# Patient Record
Sex: Female | Born: 1950 | Race: White | Hispanic: No | Marital: Married | Smoking: Current every day smoker
Health system: Southern US, Community
[De-identification: ages and names within clinical notes are randomized; demographics above are authoritative.]

## PROBLEM LIST (undated history)

## (undated) DIAGNOSIS — H269 Unspecified cataract: Secondary | ICD-10-CM

## (undated) DIAGNOSIS — D649 Anemia, unspecified: Secondary | ICD-10-CM

## (undated) DIAGNOSIS — R05 Cough: Secondary | ICD-10-CM

## (undated) DIAGNOSIS — Z720 Tobacco use: Secondary | ICD-10-CM

## (undated) DIAGNOSIS — R51 Headache: Secondary | ICD-10-CM

## (undated) DIAGNOSIS — R059 Cough, unspecified: Secondary | ICD-10-CM

## (undated) DIAGNOSIS — I739 Peripheral vascular disease, unspecified: Secondary | ICD-10-CM

## (undated) HISTORY — PX: ABDOMINAL HYSTERECTOMY: SHX81

---

## 1999-06-29 ENCOUNTER — Ambulatory Visit (HOSPITAL_COMMUNITY): Admission: RE | Admit: 1999-06-29 | Discharge: 1999-06-29 | Payer: Self-pay | Admitting: Gastroenterology

## 2001-07-16 ENCOUNTER — Encounter: Admission: RE | Admit: 2001-07-16 | Discharge: 2001-07-16 | Payer: Self-pay | Admitting: Family Medicine

## 2001-07-16 ENCOUNTER — Encounter: Payer: Self-pay | Admitting: Family Medicine

## 2001-07-27 ENCOUNTER — Encounter: Admission: RE | Admit: 2001-07-27 | Discharge: 2001-07-27 | Payer: Self-pay | Admitting: Family Medicine

## 2001-07-27 ENCOUNTER — Encounter: Payer: Self-pay | Admitting: Family Medicine

## 2001-11-20 ENCOUNTER — Encounter: Payer: Self-pay | Admitting: Family Medicine

## 2001-11-20 ENCOUNTER — Encounter: Admission: RE | Admit: 2001-11-20 | Discharge: 2001-11-20 | Payer: Self-pay | Admitting: Family Medicine

## 2002-08-31 ENCOUNTER — Encounter: Admission: RE | Admit: 2002-08-31 | Discharge: 2002-08-31 | Payer: Self-pay | Admitting: Family Medicine

## 2002-08-31 ENCOUNTER — Encounter: Payer: Self-pay | Admitting: Family Medicine

## 2004-11-07 ENCOUNTER — Emergency Department: Payer: Self-pay | Admitting: Emergency Medicine

## 2004-11-08 ENCOUNTER — Ambulatory Visit: Payer: Self-pay | Admitting: Emergency Medicine

## 2006-01-14 IMAGING — CT CT ABD-PELV W/ CM
1 of 2 series · 15 of 32 positions shown, 19 images · non-contrast
Comparison: none

REASON FOR EXAM: LUQ and RUQ abdominal pain
COMMENTS:

[Series 2: soft tissue · axial · 0.74mm/px · z∈[-640,-248]mm · 15 of 55 slices shown, 19 images]
[im 3/55  soft-tissue]
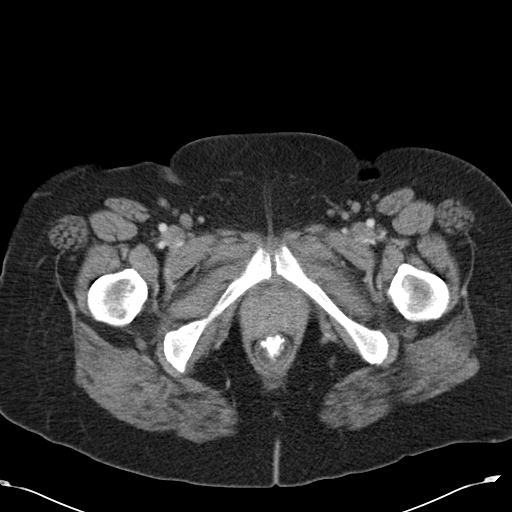
[im 3/55  bone]
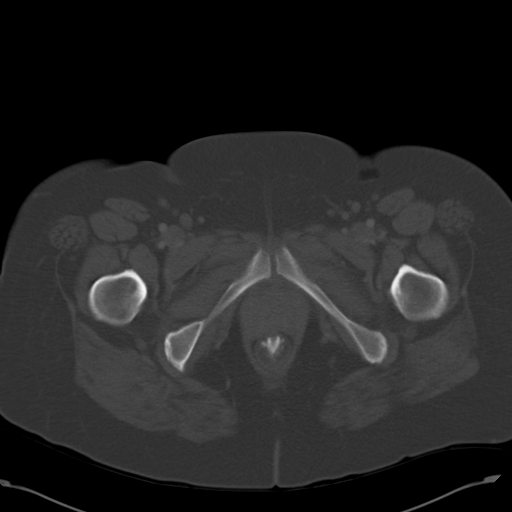
[im 8/55  soft-tissue]
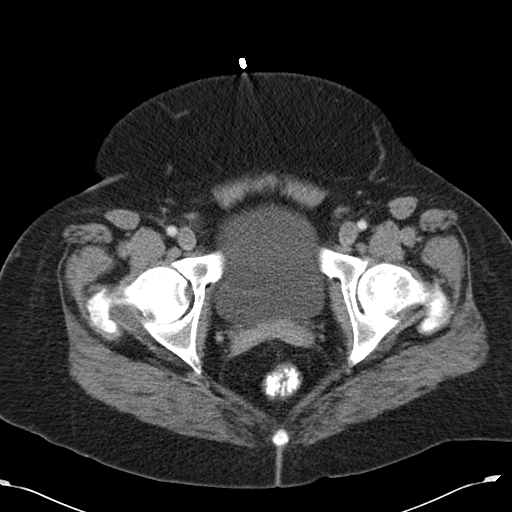
[im 12/55  soft-tissue]
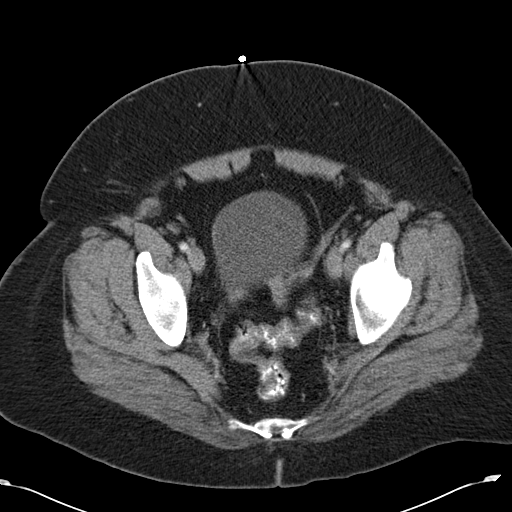
[im 15/55  soft-tissue]
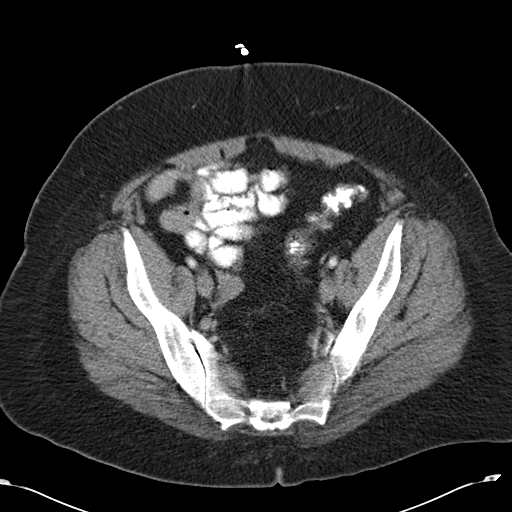
[im 19/55  soft-tissue]
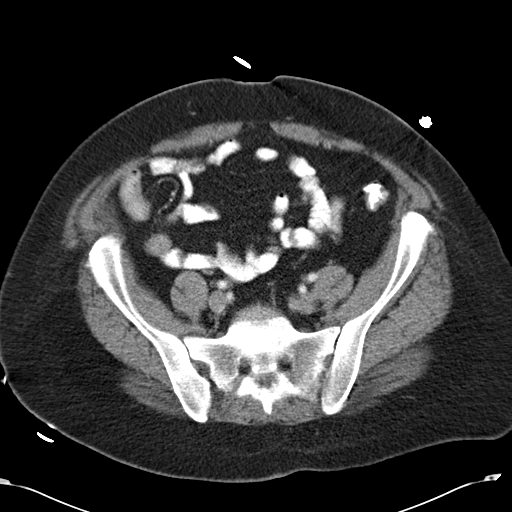
[im 24/55  soft-tissue]
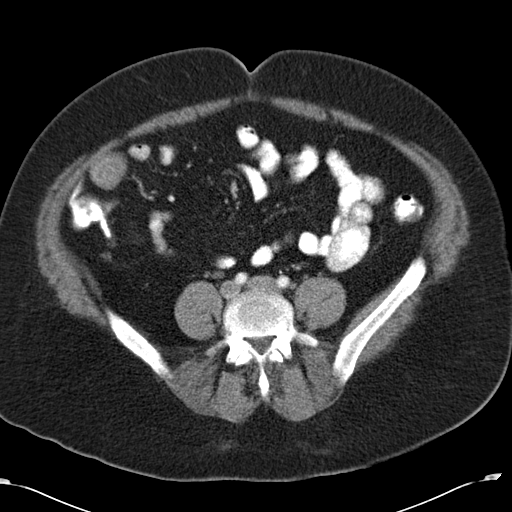
[im 29/55  soft-tissue]
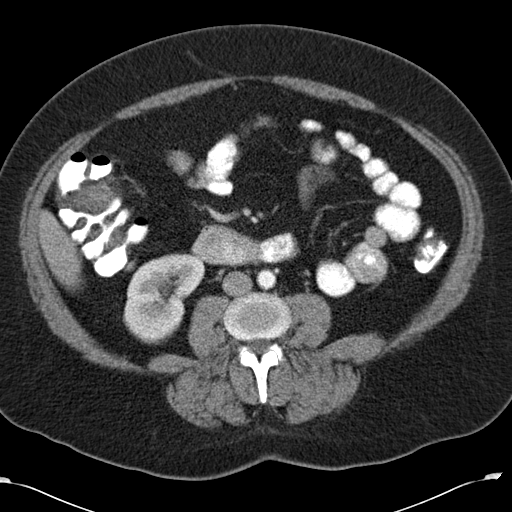
[im 31/55  soft-tissue]
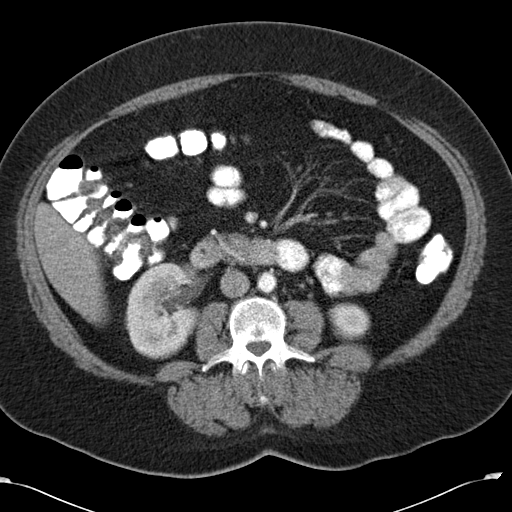
[im 36/55  soft-tissue]
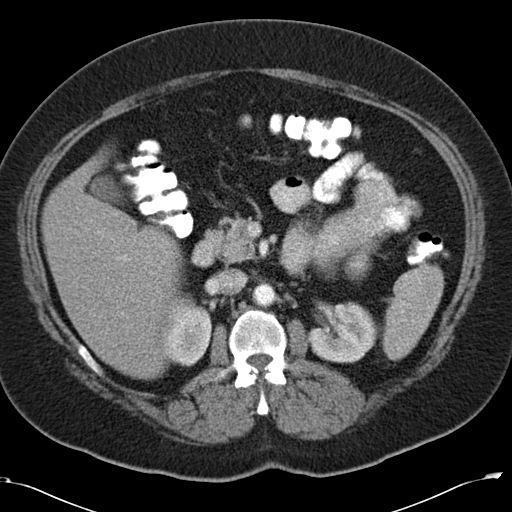
[im 36/55  bone]
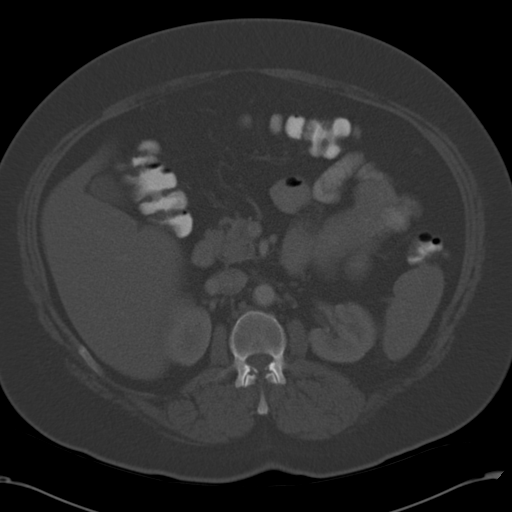
[im 40/55  soft-tissue]
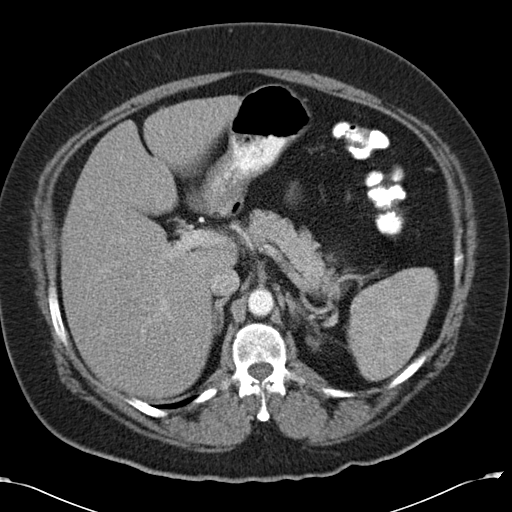
[im 43/55  soft-tissue]
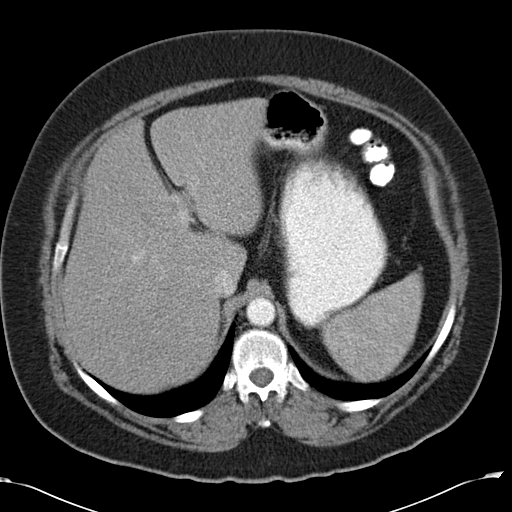
[im 45/55  lung]
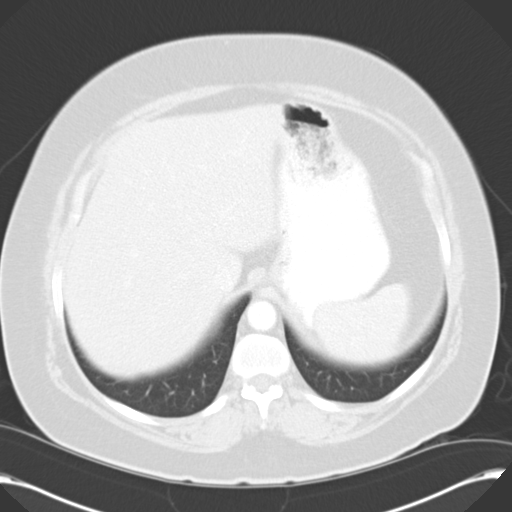
[im 47/55  soft-tissue]
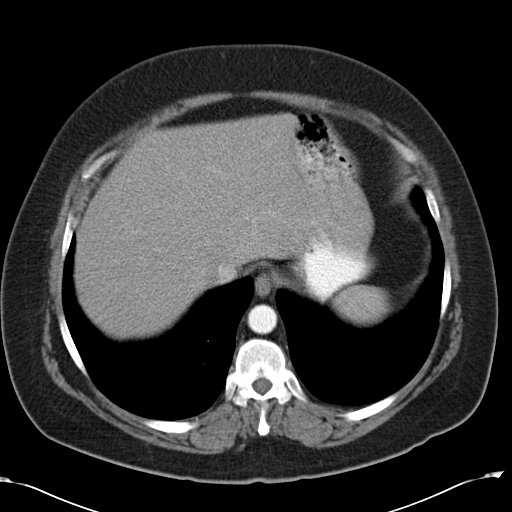
[im 47/55  lung]
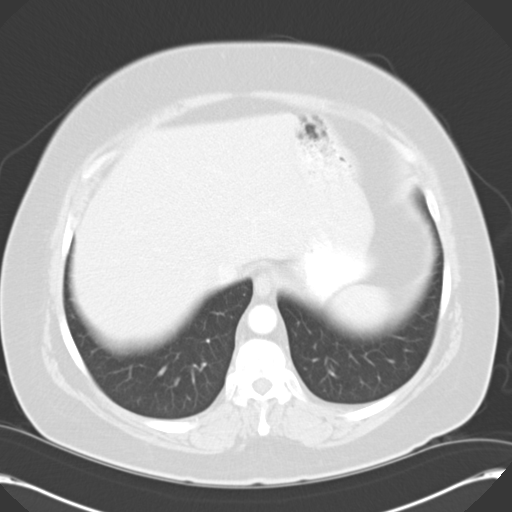
[im 50/55  lung]
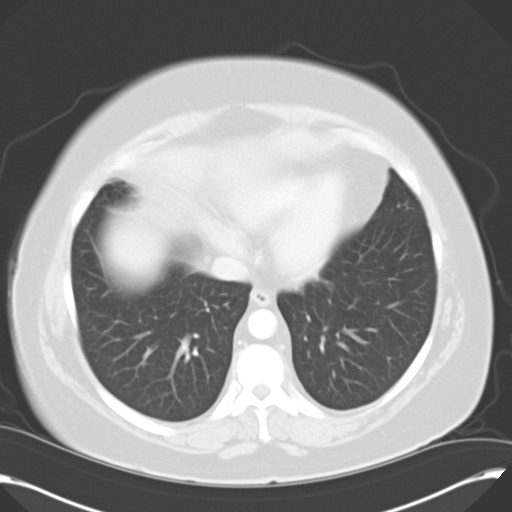
[im 52/55  soft-tissue]
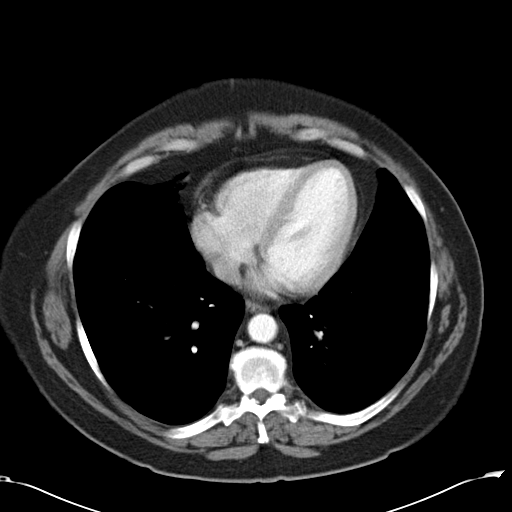
[im 52/55  lung]
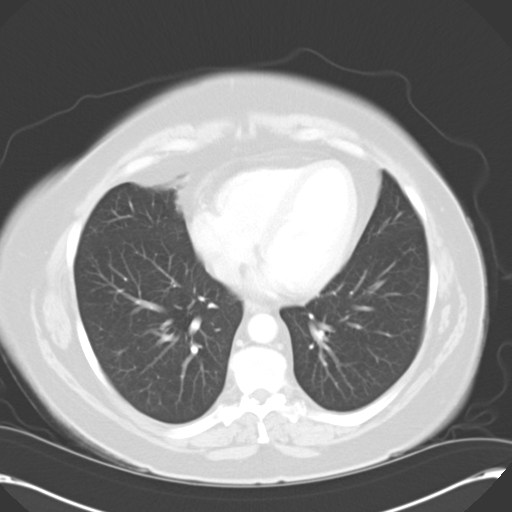

[15 of 32 positions shown; findings below may reference images not displayed]

PROCEDURE:     CT  - CT ABDOMEN / PELVIS  W  - November 08, 2004  [DATE]

RESULT:     IV and oral contrast enhanced CT of abdomen and pelvis
demonstrates the lung bases are normally aerated. There is normal appearance
and enhancement of the spleen and liver. No radiopaque gallstones are
evident. The pancreas appears to be normal. The aorta is normal in caliber.
There is no retroperitoneal mass or adenopathy. The adrenal glands and
kidneys appear unremarkable. No oral contrast is seen within the terminal
ileum. There is contrast in the colon and in other loops of small bowel. The
terminal ileum appears to be enlarged. There does not appear to be a
significant degree of surrounding inflammatory stranding within the
mesentery. More proximally the small bowel appears to show a normal caliber.
The appearance suggests thickening of the wall of the terminal ileum which
could be secondary to regional enteritis such as Crohn's disease. The
sigmoid colon also shows a slightly thickened appearance again which is
nonspecific. Correlation for the possibility of Crohn's disease would be
recommended. No abscess, free air or free fluid is evident. Again, there is
no adenopathy. The kidneys enhance normally.
IMPRESSION: Abnormal appearance of the terminal ileum. No oral contrast
is seen within the terminal ileum but the size appears to be enlarged and is
worrisome for Crohn's disease. There is also some mild thickening in the
sigmoid colon which is nonspecific but Crohn's could give a similar
appearance.

The findings were phoned to the [HOSPITAL] the completion of
the study.

## 2011-08-15 ENCOUNTER — Other Ambulatory Visit: Payer: Self-pay | Admitting: Surgical

## 2011-11-20 ENCOUNTER — Encounter (HOSPITAL_BASED_OUTPATIENT_CLINIC_OR_DEPARTMENT_OTHER): Payer: Self-pay | Admitting: *Deleted

## 2011-11-20 NOTE — H&P (Signed)
Sheila Fleming DOB: Mar 31, 1951  Chief Complaint: left knee pain  History of Present Illness The patient is a 61 year old female who is scheduled for a left knee arthroscopy with medial menisectomy by Dr. Darrelyn Hillock on Tuesday November 26, 2011. The patient has been followed for their left knee pain and swelling. Symptoms reported include pain, swelling, locking, popping and instability. She bumped her knee on a desk at work but she said she had knee pain way before that. She has obvious arthritic changes on her x-rays. Her medial joint is narrow and MRI reviewed shows a sort of chronic appearing complex tear of the medial meniscus with arthritis.   Problem List/Past MedicalHistory Osteoarthritis, Knee (715.96) Sprain/Strain, Lumbar (847.2) Hypertension Hypercholesterolemia Migraine Headache   Allergies No Known Drug Allergies.    Family History Cancer. mother Diabetes Mellitus. mother Rheumatoid Arthritis. mother Congestive Heart Failure. mother Heart Disease. father and grandfather mothers side Hypertension. mother mother and father Chronic Obstructive Lung Disease. mother   Social History Exercise. Exercises weekly does running / walking Living situation. live with spouse Children. 2 Marital status. married Tobacco use. current every day smoker; smoke(d) 1 pack(s) per day; uses less than half 1/2 can(s) smokeless per week Tobacco / smoke exposure. yes Alcohol use. current drinker; only occasionally per week   Medication History Aspirin 81mg  daily B complex vitamin Os-cal 600 mg Fish oil     Past Surgical History Hysterectomy. partial (non-cancerous)    Vitals Weight: 230 lbs Height: 66 in BP: HR:  Review of Systems General:Not Present- Chills, Fever, Night Sweats, Appetite Loss, Fatigue, Feeling sick, Weight Gain and Weight Loss. HEENT:Not Present- Sensitivity to light, Hearing problems, Nose Bleed and Ringing in  the Ears. Neck:Not Present- Swollen Glands and Neck Mass. Respiratory:Not Present- Snoring, Chronic Cough, Bloody sputum and Dyspnea. Cardiovascular:Present- Leg Cramps. Not Present- Shortness of Breath, Chest Pain, Swelling of Extremities and Palpitations. Gastrointestinal:Not Present- Bloody Stool, Heartburn, Abdominal Pain, Vomiting, Nausea and Incontinence of Stool. Female Genitourinary:Not Present- Blood in Urine, Menstrual Irregularities, Frequency, Incontinence and Nocturia. Musculoskeletal:Present- Joint Stiffness, Joint Swelling and Joint Pain. Not Present- Muscle Weakness, Muscle Pain and Back Pain. Psychiatric:Not Present- Anxiety, Depression and Memory Loss. Endocrine:Not Present- Cold Intolerance, Heat Intolerance, Excessive hunger and Excessive Thirst. Hematology:Not Present- Abnormal Bleeding, Anemia, Blood Clots and Easy Bruising.    Physical Exam She is alert and oriented. She is in no acute distress. She does have pain with range of motion of the left knee specifically extension. She is unable to fully extend, lacking about 10 degrees of extension. She does have a full range of motion with flexion. there is tenderness to palpation over the medial joint line. Calves are soft and nontender. Sensation and circulation are intact. There is a moderate crepitus in the left knee. In the right knee there is a mild crepitus with range of motion but no pain with range of motion or tenderness to palpation. Heart sounds normal with no murmurs. Lungs clear to auscultation. Abdomen soft and nontender. Bowel sounds active. EOM intact. Neck supple, no bruits.  Partial dentures on the bottom. Full on the top.     RADIOGRAPHS: AP and lateral views of the left knee, this shows she does have some arthritic changes in the medial compartment and almost nearly bone on bone in the patellofemoral compartment of the left knee. No fractures were noted.   Assessment & Plan Osteoarthritis,  Knee (715.96) Acute Medial Meniscal Tear (836.0) Left knee arthroscopy with medial menisectomy  Ardeen Jourdain, PA-C

## 2011-11-20 NOTE — Progress Notes (Signed)
Pt instructed npo p mn 8/19.  To [facility] 8/20 @ 1030., needs hgb, ekg on arrival. 

## 2011-11-26 ENCOUNTER — Ambulatory Visit (HOSPITAL_BASED_OUTPATIENT_CLINIC_OR_DEPARTMENT_OTHER): Payer: No Typology Code available for payment source | Admitting: Anesthesiology

## 2011-11-26 ENCOUNTER — Other Ambulatory Visit: Payer: Self-pay

## 2011-11-26 ENCOUNTER — Encounter (HOSPITAL_BASED_OUTPATIENT_CLINIC_OR_DEPARTMENT_OTHER): Payer: Self-pay | Admitting: Anesthesiology

## 2011-11-26 ENCOUNTER — Encounter (HOSPITAL_BASED_OUTPATIENT_CLINIC_OR_DEPARTMENT_OTHER): Payer: Self-pay | Admitting: *Deleted

## 2011-11-26 ENCOUNTER — Encounter (HOSPITAL_BASED_OUTPATIENT_CLINIC_OR_DEPARTMENT_OTHER)
Admission: RE | Disposition: A | Discharge status: Home / Self Care | Payer: Self-pay | Source: Ambulatory Visit | Attending: Orthopedic Surgery

## 2011-11-26 ENCOUNTER — Ambulatory Visit (HOSPITAL_BASED_OUTPATIENT_CLINIC_OR_DEPARTMENT_OTHER)
Admission: RE | Admit: 2011-11-26 | Discharge: 2011-11-26 | Disposition: A | Discharge status: Home / Self Care | Payer: No Typology Code available for payment source | Source: Ambulatory Visit | Attending: Orthopedic Surgery | Admitting: Orthopedic Surgery

## 2011-11-26 DIAGNOSIS — Z7982 Long term (current) use of aspirin: Secondary | ICD-10-CM | POA: Insufficient documentation

## 2011-11-26 DIAGNOSIS — G43909 Migraine, unspecified, not intractable, without status migrainosus: Secondary | ICD-10-CM | POA: Insufficient documentation

## 2011-11-26 DIAGNOSIS — I10 Essential (primary) hypertension: Secondary | ICD-10-CM | POA: Insufficient documentation

## 2011-11-26 DIAGNOSIS — M1710 Unilateral primary osteoarthritis, unspecified knee: Secondary | ICD-10-CM | POA: Insufficient documentation

## 2011-11-26 DIAGNOSIS — M23329 Other meniscus derangements, posterior horn of medial meniscus, unspecified knee: Secondary | ICD-10-CM | POA: Diagnosis present

## 2011-11-26 DIAGNOSIS — IMO0002 Reserved for concepts with insufficient information to code with codable children: Secondary | ICD-10-CM | POA: Insufficient documentation

## 2011-11-26 DIAGNOSIS — S83289A Other tear of lateral meniscus, current injury, unspecified knee, initial encounter: Secondary | ICD-10-CM | POA: Insufficient documentation

## 2011-11-26 DIAGNOSIS — X58XXXA Exposure to other specified factors, initial encounter: Secondary | ICD-10-CM | POA: Insufficient documentation

## 2011-11-26 DIAGNOSIS — M23349 Other meniscus derangements, anterior horn of lateral meniscus, unspecified knee: Secondary | ICD-10-CM | POA: Diagnosis present

## 2011-11-26 DIAGNOSIS — E7800 Pure hypercholesterolemia, unspecified: Secondary | ICD-10-CM | POA: Insufficient documentation

## 2011-11-26 DIAGNOSIS — M1712 Unilateral primary osteoarthritis, left knee: Secondary | ICD-10-CM | POA: Diagnosis present

## 2011-11-26 HISTORY — DX: Cough: R05

## 2011-11-26 HISTORY — DX: Headache: R51

## 2011-11-26 HISTORY — DX: Peripheral vascular disease, unspecified: I73.9

## 2011-11-26 HISTORY — DX: Unspecified cataract: H26.9

## 2011-11-26 HISTORY — DX: Cough, unspecified: R05.9

## 2011-11-26 HISTORY — DX: Anemia, unspecified: D64.9

## 2011-11-26 HISTORY — PX: KNEE ARTHROSCOPY: SHX127

## 2011-11-26 LAB — POCT HEMOGLOBIN-HEMACUE: Hemoglobin: 13.5 g/dL (ref 12.0–15.0)

## 2011-11-26 SURGERY — ARTHROSCOPY, KNEE
Anesthesia: General | Site: Knee | Laterality: Left | Wound class: Clean

## 2011-11-26 MED ORDER — FENTANYL CITRATE 0.05 MG/ML IJ SOLN
INTRAMUSCULAR | Status: DC | PRN
  Administered 2011-11-26: 25 ug via INTRAVENOUS
  Administered 2011-11-26 (×2): 50 ug via INTRAVENOUS
  Administered 2011-11-26 (×3): 25 ug via INTRAVENOUS

## 2011-11-26 MED ORDER — FENTANYL CITRATE 0.05 MG/ML IJ SOLN
25.0000 ug | INTRAMUSCULAR | Status: DC | PRN
  Administered 2011-11-26: 25 ug via INTRAVENOUS

## 2011-11-26 MED ORDER — LIDOCAINE HCL (CARDIAC) 20 MG/ML IV SOLN
INTRAVENOUS | Status: DC | PRN
  Administered 2011-11-26: 50 mg via INTRAVENOUS

## 2011-11-26 MED ORDER — MIDAZOLAM HCL 5 MG/5ML IJ SOLN
INTRAMUSCULAR | Status: DC | PRN
  Administered 2011-11-26: 2 mg via INTRAVENOUS

## 2011-11-26 MED ORDER — OXYCODONE-ACETAMINOPHEN 10-650 MG PO TABS: 1.0000 | ORAL_TABLET | Freq: Four times a day (QID) | ORAL | Status: AC | PRN

## 2011-11-26 MED ORDER — OXYCODONE-ACETAMINOPHEN 5-325 MG PO TABS
2.0000 | ORAL_TABLET | Freq: Once | ORAL | Status: AC
  Administered 2011-11-26: 2 via ORAL

## 2011-11-26 MED ORDER — BUPIVACAINE-EPINEPHRINE 0.25% -1:200000 IJ SOLN
INTRAMUSCULAR | Status: DC | PRN
  Administered 2011-11-26: 30 mL

## 2011-11-26 MED ORDER — PROMETHAZINE HCL 25 MG/ML IJ SOLN: 6.2500 mg | INTRAMUSCULAR | Status: DC | PRN

## 2011-11-26 MED ORDER — GLYCOPYRROLATE 0.2 MG/ML IJ SOLN
INTRAMUSCULAR | Status: DC | PRN
  Administered 2011-11-26: 0.2 mg via INTRAVENOUS

## 2011-11-26 MED ORDER — SODIUM CHLORIDE 0.9 % IR SOLN
Status: DC | PRN
  Administered 2011-11-26: 9000 mL

## 2011-11-26 MED ORDER — CEFAZOLIN SODIUM-DEXTROSE 2-3 GM-% IV SOLR
2.0000 g | Freq: Once | INTRAVENOUS | Status: AC
  Administered 2011-11-26: 2 g via INTRAVENOUS

## 2011-11-26 MED ORDER — BACITRACIN-NEOMYCIN-POLYMYXIN 400-5-5000 EX OINT
TOPICAL_OINTMENT | CUTANEOUS | Status: DC | PRN
  Administered 2011-11-26: 1 via TOPICAL

## 2011-11-26 MED ORDER — LACTATED RINGERS IV SOLN
INTRAVENOUS | Status: DC
  Administered 2011-11-26 (×2): via INTRAVENOUS

## 2011-11-26 MED ORDER — DEXAMETHASONE SODIUM PHOSPHATE 4 MG/ML IJ SOLN
INTRAMUSCULAR | Status: DC | PRN
  Administered 2011-11-26: 8 mg via INTRAVENOUS

## 2011-11-26 MED ORDER — ONDANSETRON HCL 4 MG/2ML IJ SOLN
INTRAMUSCULAR | Status: DC | PRN
  Administered 2011-11-26: 4 mg via INTRAVENOUS

## 2011-11-26 MED ORDER — PROPOFOL 10 MG/ML IV EMUL
INTRAVENOUS | Status: DC | PRN
  Administered 2011-11-26: 200 mg via INTRAVENOUS

## 2011-11-26 SURGICAL SUPPLY — 43 items
BANDAGE ELASTIC 4 VELCRO ST LF (GAUZE/BANDAGES/DRESSINGS) ×2 IMPLANT
BANDAGE ELASTIC 6 VELCRO ST LF (GAUZE/BANDAGES/DRESSINGS) ×2 IMPLANT
BLADE CUDA 5.5 (BLADE) IMPLANT
BLADE CUTTER GATOR 3.5 (BLADE) IMPLANT
BLADE CUTTER MENIS 5.5 (BLADE) IMPLANT
BLADE GREAT WHITE 4.2 (BLADE) ×2 IMPLANT
BLADE SURG 15 STRL LF DISP TIS (BLADE) IMPLANT
BLADE SURG 15 STRL SS (BLADE)
BNDG COHESIVE 6X5 TAN NS LF (GAUZE/BANDAGES/DRESSINGS) ×2 IMPLANT
CANISTER SUCT LVC 12 LTR MEDI- (MISCELLANEOUS) ×4 IMPLANT
CANISTER SUCTION 2500CC (MISCELLANEOUS) ×2 IMPLANT
CLOTH BEACON ORANGE TIMEOUT ST (SAFETY) ×2 IMPLANT
DRAPE ARTHROSCOPY W/POUCH 114 (DRAPES) ×2 IMPLANT
DRAPE LG THREE QUARTER DISP (DRAPES) ×2 IMPLANT
DRSG EMULSION OIL 3X3 NADH (GAUZE/BANDAGES/DRESSINGS) ×2 IMPLANT
DRSG PAD ABDOMINAL 8X10 ST (GAUZE/BANDAGES/DRESSINGS) ×2 IMPLANT
DURAPREP 26ML APPLICATOR (WOUND CARE) ×2 IMPLANT
ELECT MENISCUS 165MM 90D (ELECTRODE) IMPLANT
ELECT REM PT RETURN 9FT ADLT (ELECTROSURGICAL)
ELECTRODE REM PT RTRN 9FT ADLT (ELECTROSURGICAL) IMPLANT
GLOVE BIO SURGEON STRL SZ 6.5 (GLOVE) ×2 IMPLANT
GLOVE ECLIPSE 8.0 STRL XLNG CF (GLOVE) ×2 IMPLANT
GLOVE INDICATOR 6.5 STRL GRN (GLOVE) ×2 IMPLANT
GLOVE INDICATOR 8.5 STRL (GLOVE) ×4 IMPLANT
GLOVE SURG ORTHO 6.0 STRL STRW (GLOVE) ×2 IMPLANT
GOWN PREVENTION PLUS LG XLONG (DISPOSABLE) ×2 IMPLANT
GOWN STRL REIN XL XLG (GOWN DISPOSABLE) ×2 IMPLANT
IV NS IRRIG 3000ML ARTHROMATIC (IV SOLUTION) ×6 IMPLANT
KNEE WRAP E Z 3 GEL PACK (MISCELLANEOUS) ×2 IMPLANT
MINI VAC (SURGICAL WAND) ×2 IMPLANT
PACK ARTHROSCOPY DSU (CUSTOM PROCEDURE TRAY) ×2 IMPLANT
PACK BASIN DAY SURGERY FS (CUSTOM PROCEDURE TRAY) ×2 IMPLANT
PADDING CAST COTTON 6X4 STRL (CAST SUPPLIES) ×2 IMPLANT
PENCIL BUTTON HOLSTER BLD 10FT (ELECTRODE) IMPLANT
SET ARTHROSCOPY TUBING (MISCELLANEOUS) ×1
SET ARTHROSCOPY TUBING LN (MISCELLANEOUS) ×1 IMPLANT
SPONGE GAUZE 4X4 12PLY (GAUZE/BANDAGES/DRESSINGS) ×2 IMPLANT
SPONGE SURGIFOAM ABS GEL 12-7 (HEMOSTASIS) IMPLANT
SUT ETHILON 3 0 PS 1 (SUTURE) ×2 IMPLANT
SYR CONTROL 10ML LL (SYRINGE) IMPLANT
TOWEL OR 17X24 6PK STRL BLUE (TOWEL DISPOSABLE) ×2 IMPLANT
WAND 90 DEG TURBOVAC W/CORD (SURGICAL WAND) ×2 IMPLANT
WATER STERILE IRR 500ML POUR (IV SOLUTION) ×2 IMPLANT

## 2011-11-26 NOTE — Transfer of Care (Signed)
Immediate Anesthesia Transfer of Care Note  Patient: Sheila Fleming  Procedure(s) Performed: Procedure(s) (LRB): ARTHROSCOPY KNEE (Left)  Patient Location: PACU  Anesthesia Type: General  Level of Consciousness: awake, oriented, sedated and patient cooperative  Airway & Oxygen Therapy: Patient Spontanous Breathing and Patient connected to face mask oxygen  Post-op Assessment: Report given to PACU RN and Post -op Vital signs reviewed and stable  Post vital signs: Reviewed and stable  Complications: No apparent anesthesia complications

## 2011-11-26 NOTE — Brief Op Note (Signed)
11/26/2011  2:13 PM  PATIENT:  Sheila Fleming  61 y.o. female  PRE-OPERATIVE DIAGNOSIS:  LEFT KNEE MENISCAL TEAR,Medial and Lateral and Osteoarthritis POST-OPERATIVE DIAGNOSIS:  LEFT KNEE MENISCAL TEAR,medial and Lateral and osteoarthritis.  PROCEDURE:  Procedure(s) (LRB): ARTHROSCOPY KNEE (Left) and medial and Lateral Meniscectomies and synovectomy of Suprapatellar Pouch and abraision Chondroplasty of Medial Femoral Condyle and Microfracture Technique of Medial Femoral Condyle down to Bleeding Bone.  SURGEON:  Surgeon(s) and Role:    * Jacki Cones, MD - Primary    ASSISTANTS: OR Tech.   ANESTHESIA:   general  EBL:  Total I/O In: 300 [I.V.:300] Out: -   BLOOD ADMINISTERED:none  DRAINS: none   LOCAL MEDICATIONS USED:  MARCAINE  30cc of 0.25% Marcaine with Epinephrine.   SPECIMEN:  No Specimen  DISPOSITION OF SPECIMEN:  N/A  COUNTS:  YES  TOURNIQUET:  * No tourniquets in log *  DICTATION: .Other Dictation: Dictation Number [id number]  PLAN OF CARE: Discharge to home after PACU  PATIENT DISPOSITION:  PACU - hemodynamically stable.   Delay start of Pharmacological VTE agent (>24hrs) due to surgical blood loss or risk of bleeding: yes

## 2011-11-26 NOTE — Anesthesia Postprocedure Evaluation (Signed)
Anesthesia Post Note  Patient: Sheila Fleming  Procedure(s) Performed: Procedure(s) (LRB): ARTHROSCOPY KNEE (Left)  Anesthesia type: General  Patient location: PACU  Post pain: Pain level controlled  Post assessment: Post-op Vital signs reviewed  Last Vitals: BP 176/109  Pulse 82  Temp 36.3 C (Oral)  Resp 16  Ht 5\' 6"  (1.676 m)  Wt 246 lb (111.585 kg)  BMI 39.71 kg/m2  SpO2 99%  Post vital signs: Reviewed  Level of consciousness: sedated  Complications: No apparent anesthesia complications

## 2011-11-26 NOTE — H&P (View-Only) (Signed)
Pt instructed npo p mn 8/19.  To [facility] 8/20 @ 1030., needs hgb, ekg on arrival.

## 2011-11-26 NOTE — Interval H&P Note (Signed)
History and Physical Interval Note:  11/26/2011 1:02 PM  Sheila Fleming  has presented today for surgery, with the diagnosis of LEFT KNEE MENISCAL TEAR  The various methods of treatment have been discussed with the patient and family. After consideration of risks, benefits and other options for treatment, the patient has consented to  Procedure(s) (LRB): ARTHROSCOPY KNEE (Left) as a surgical intervention .  The patient's history has been reviewed, patient examined, no change in status, stable for surgery.  I have reviewed the patient's chart and labs.  Questions were answered to the patient's satisfaction.     Stavros Cail A

## 2011-11-26 NOTE — Anesthesia Preprocedure Evaluation (Addendum)
Anesthesia Evaluation  Patient identified by MRN, date of birth, ID band Patient awake    Reviewed: Allergy & Precautions, H&P , NPO status , Patient's Chart, lab work & pertinent test results  Airway Mallampati: II TM Distance: >3 FB Neck ROM: Full    Dental  (+) Upper Dentures, Partial Lower and Dental Advisory Given   Pulmonary Current Smoker,  breath sounds clear to auscultation  Pulmonary exam normal       Cardiovascular Exercise Tolerance: Good negative cardio ROS  Rhythm:Regular Rate:Normal  Borderline HTN per patient history. No meds currently   Neuro/Psych  Headaches, negative psych ROS   GI/Hepatic negative GI ROS, Neg liver ROS,   Endo/Other  negative endocrine ROS  Renal/GU negative Renal ROS  negative genitourinary   Musculoskeletal negative musculoskeletal ROS (+)   Abdominal (+) + obese,   Peds negative pediatric ROS (+)  Hematology negative hematology ROS (+)   Anesthesia Other Findings H/O Bell's Palsy left side of face. Noticeable left lid droop compared to right.  Reproductive/Obstetrics negative OB ROS                        Anesthesia Physical Anesthesia Plan  ASA: II  Anesthesia Plan: General   Post-op Pain Management:    Induction: Intravenous  Airway Management Planned: LMA  Additional Equipment:   Intra-op Plan:   Post-operative Plan: Extubation in OR  Informed Consent: I have reviewed the patients History and Physical, chart, labs and discussed the procedure including the risks, benefits and alternatives for the proposed anesthesia with the patient or authorized representative who has indicated his/her understanding and acceptance.   Dental advisory given  Plan Discussed with: CRNA  Anesthesia Plan Comments:         Anesthesia Quick Evaluation

## 2011-11-26 NOTE — Anesthesia Procedure Notes (Signed)
Procedure Name: LMA Insertion Date/Time: 11/26/2011 1:17 PM Performed by: Renella Cunas D Pre-anesthesia Checklist: Patient identified, Emergency Drugs available, Suction available and Patient being monitored Patient Re-evaluated:Patient Re-evaluated prior to inductionOxygen Delivery Method: Circle System Utilized Preoxygenation: Pre-oxygenation with 100% oxygen Intubation Type: IV induction Ventilation: Mask ventilation without difficulty LMA: LMA inserted LMA Size: 4.0 Number of attempts: 1 Airway Equipment and Method: bite block Placement Confirmation: positive ETCO2 Tube secured with: Tape Dental Injury: Teeth and Oropharynx as per pre-operative assessment

## 2011-11-27 ENCOUNTER — Encounter (HOSPITAL_BASED_OUTPATIENT_CLINIC_OR_DEPARTMENT_OTHER): Payer: Self-pay | Admitting: Orthopedic Surgery

## 2011-11-27 NOTE — Op Note (Signed)
NAMEARLINA, SABINA                  ACCOUNT NO.:  [id number]  MEDICAL RECORD NO.:  [id number]  LOCATION:                                 FACILITY:  PHYSICIAN:  Georges Lynch. Darrelyn Hillock, M.D.     DATE OF BIRTH:  DATE OF PROCEDURE:  11/26/2011 DATE OF DISCHARGE:                              OPERATIVE REPORT   SURGEON:  Georges Lynch. Darrelyn Hillock, M.D.  ASSISTANT:  Nurse.  PREOPERATIVE DIAGNOSES: 1. Degenerative arthritis, left knee. 2. Torn medial meniscus, left knee. 3. Torn lateral meniscus, left knee.  POSTOPERATIVE DIAGNOSES: 1. Degenerative arthritis, left knee. 2. Torn medial meniscus, left knee. 3. Torn lateral meniscus, left knee.  OPERATION: 1. Diagnostic arthroscopy, left knee. 2. Medial meniscectomy, left knee. 3. Lateral meniscectomy, left knee. 4. Abrasion chondroplasty, medial femoral condyle, left knee. 5. Microfracture technique, medial femoral condyle, left knee. 6. Synovectomy, suprapatellar pouch for chronic synovitis, left knee.  PROCEDURE:  Under general anesthesia, routine orthopedic prep and draping of left lower extremity was carried out.  The patient had 2 g of IV Ancef preop.  At this time, the patient's left leg was placed in a knee holder.  The appropriate time-out was carried out prior to making any incisions and also marked the appropriate left leg in the holding area.  At this particular time, incision was made over suprapatellar pouch.  Inflow cannula was inserted and knee was distended with saline. Another small punctate incision was made in the anterolateral joint. The arthroscope was entered from the lateral approach and a complete diagnostic arthroscopy was carried out.  I went up in the suprapatellar pouch.  She had severe chronic synovitis and a synovectomy utilizing the ArthroCare.  Following that, I went down the lateral joint.  She had some early arthritic changes that she had a peripheral tear of the lateral meniscus, and I went ahead and did a  partial lateral meniscectomy.  The cruciates were examined, they were intact.  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  I did a partial medial meniscectomy of the posterior horn, followed by abrasion chondroplasty of the medial femoral condyle followed by microfracture technique in the usual fashion of the medial femoral condyle.  Thoroughly irrigated out the knee, removed all the fluid, closed all 3 punctate incisions with 3-0 nylon suture.  I injected 30 mL of 0.25% Marcaine, epinephrine into the knee joint and a sterile Neosporin bundle dressing was applied.  1. The patient will be on aspirin 325 mg b.i.d. as an anticoagulant. 2. She will be on Percocet 10/250 one every 4 hours p.r.n. for pain. 3. She will be on crutches partial to full weightbearing as tolerated. 4. She will see me in the office in about 10 or 12 days or prior to if     there is any problem whatsoever.          ______________________________ Georges Lynch Darrelyn Hillock, M.D.     RAG/MEDQ  D:  11/26/2011  T:  11/27/2011  Job:  [id number]

## 2011-12-02 ENCOUNTER — Encounter (HOSPITAL_BASED_OUTPATIENT_CLINIC_OR_DEPARTMENT_OTHER): Payer: Self-pay

## 2012-05-13 ENCOUNTER — Other Ambulatory Visit: Payer: Self-pay | Admitting: Obstetrics and Gynecology

## 2012-05-13 DIAGNOSIS — Z1231 Encounter for screening mammogram for malignant neoplasm of breast: Secondary | ICD-10-CM

## 2012-05-18 ENCOUNTER — Ambulatory Visit
Admission: RE | Admit: 2012-05-18 | Discharge: 2012-05-18 | Disposition: A | Discharge status: Home / Self Care | Payer: No Typology Code available for payment source | Source: Ambulatory Visit | Attending: Obstetrics and Gynecology | Admitting: Obstetrics and Gynecology

## 2012-05-18 DIAGNOSIS — Z1231 Encounter for screening mammogram for malignant neoplasm of breast: Secondary | ICD-10-CM

## 2021-12-17 ENCOUNTER — Observation Stay: Payer: Medicare Other

## 2021-12-17 ENCOUNTER — Emergency Department: Payer: Medicare Other

## 2021-12-17 ENCOUNTER — Observation Stay
Admission: EM | Admit: 2021-12-17 | Discharge: 2021-12-18 | Disposition: A | Discharge status: Home Health | Payer: Medicare Other | Attending: Internal Medicine | Admitting: Internal Medicine

## 2021-12-17 ENCOUNTER — Other Ambulatory Visit: Payer: Self-pay

## 2021-12-17 ENCOUNTER — Encounter: Payer: Self-pay | Admitting: Emergency Medicine

## 2021-12-17 DIAGNOSIS — R200 Anesthesia of skin: Secondary | ICD-10-CM | POA: Diagnosis present

## 2021-12-17 DIAGNOSIS — Z79899 Other long term (current) drug therapy: Secondary | ICD-10-CM | POA: Insufficient documentation

## 2021-12-17 DIAGNOSIS — F17210 Nicotine dependence, cigarettes, uncomplicated: Secondary | ICD-10-CM | POA: Diagnosis not present

## 2021-12-17 DIAGNOSIS — Z720 Tobacco use: Secondary | ICD-10-CM

## 2021-12-17 DIAGNOSIS — I639 Cerebral infarction, unspecified: Secondary | ICD-10-CM | POA: Diagnosis not present

## 2021-12-17 DIAGNOSIS — I63212 Cerebral infarction due to unspecified occlusion or stenosis of left vertebral arteries: Principal | ICD-10-CM | POA: Insufficient documentation

## 2021-12-17 DIAGNOSIS — I6502 Occlusion and stenosis of left vertebral artery: Secondary | ICD-10-CM

## 2021-12-17 DIAGNOSIS — N179 Acute kidney failure, unspecified: Secondary | ICD-10-CM | POA: Diagnosis not present

## 2021-12-17 DIAGNOSIS — Z20822 Contact with and (suspected) exposure to covid-19: Secondary | ICD-10-CM | POA: Diagnosis not present

## 2021-12-17 DIAGNOSIS — I10 Essential (primary) hypertension: Secondary | ICD-10-CM | POA: Diagnosis present

## 2021-12-17 HISTORY — DX: Tobacco use: Z72.0

## 2021-12-17 LAB — CBG MONITORING, ED: Glucose-Capillary: 110 mg/dL — ABNORMAL HIGH (ref 70–99)

## 2021-12-17 LAB — URINE DRUG SCREEN, QUALITATIVE (ARMC ONLY)
Amphetamines, Ur Screen: NOT DETECTED
Barbiturates, Ur Screen: NOT DETECTED
Benzodiazepine, Ur Scrn: NOT DETECTED
Cannabinoid 50 Ng, Ur ~~LOC~~: NOT DETECTED
Cocaine Metabolite,Ur ~~LOC~~: NOT DETECTED
MDMA (Ecstasy)Ur Screen: NOT DETECTED
Methadone Scn, Ur: NOT DETECTED
Opiate, Ur Screen: NOT DETECTED
Phencyclidine (PCP) Ur S: NOT DETECTED
Tricyclic, Ur Screen: NOT DETECTED

## 2021-12-17 LAB — URINALYSIS, ROUTINE W REFLEX MICROSCOPIC
Bilirubin Urine: NEGATIVE
Glucose, UA: NEGATIVE mg/dL
Hgb urine dipstick: NEGATIVE
Ketones, ur: NEGATIVE mg/dL
Leukocytes,Ua: NEGATIVE
Nitrite: NEGATIVE
Protein, ur: NEGATIVE mg/dL
Specific Gravity, Urine: 1.003 — ABNORMAL LOW (ref 1.005–1.030)
pH: 6 (ref 5.0–8.0)

## 2021-12-17 LAB — COMPREHENSIVE METABOLIC PANEL
ALT: 12 U/L (ref 0–44)
AST: 17 U/L (ref 15–41)
Albumin: 4.3 g/dL (ref 3.5–5.0)
Alkaline Phosphatase: 121 U/L (ref 38–126)
Anion gap: 8 (ref 5–15)
BUN: 16 mg/dL (ref 8–23)
CO2: 25 mmol/L (ref 22–32)
Calcium: 9.5 mg/dL (ref 8.9–10.3)
Chloride: 107 mmol/L (ref 98–111)
Creatinine, Ser: 1.15 mg/dL — ABNORMAL HIGH (ref 0.44–1.00)
GFR, Estimated: 51 mL/min — ABNORMAL LOW (ref >60)
Glucose, Bld: 108 mg/dL — ABNORMAL HIGH (ref 70–99)
Potassium: 4.2 mmol/L (ref 3.5–5.1)
Sodium: 140 mmol/L (ref 135–145)
Total Bilirubin: 0.7 mg/dL (ref 0.3–1.2)
Total Protein: 7.9 g/dL (ref 6.5–8.1)

## 2021-12-17 LAB — DIFFERENTIAL
Abs Immature Granulocytes: 0.03 10*3/uL (ref 0.00–0.07)
Basophils Absolute: 0.1 10*3/uL (ref 0.0–0.1)
Basophils Relative: 1 %
Eosinophils Absolute: 0.1 10*3/uL (ref 0.0–0.5)
Eosinophils Relative: 2 %
Immature Granulocytes: 0 %
Lymphocytes Relative: 22 %
Lymphs Abs: 1.8 10*3/uL (ref 0.7–4.0)
Monocytes Absolute: 0.4 10*3/uL (ref 0.1–1.0)
Monocytes Relative: 5 %
Neutro Abs: 5.9 10*3/uL (ref 1.7–7.7)
Neutrophils Relative %: 70 %

## 2021-12-17 LAB — CBC
HCT: 43.8 % (ref 36.0–46.0)
Hemoglobin: 14.9 g/dL (ref 12.0–15.0)
MCH: 30.4 pg (ref 26.0–34.0)
MCHC: 34 g/dL (ref 30.0–36.0)
MCV: 89.4 fL (ref 80.0–100.0)
Platelets: 247 10*3/uL (ref 150–400)
RBC: 4.9 MIL/uL (ref 3.87–5.11)
RDW: 13.5 % (ref 11.5–15.5)
WBC: 8.4 10*3/uL (ref 4.0–10.5)
nRBC: 0 % (ref 0.0–0.2)

## 2021-12-17 LAB — HEMOGLOBIN A1C
Hgb A1c MFr Bld: 5.3 % (ref 4.8–5.6)
Mean Plasma Glucose: 105.41 mg/dL

## 2021-12-17 LAB — PROTIME-INR
INR: 1 (ref 0.8–1.2)
Prothrombin Time: 13.2 seconds (ref 11.4–15.2)

## 2021-12-17 LAB — RESP PANEL BY RT-PCR (FLU A&B, COVID) ARPGX2
Influenza A by PCR: NEGATIVE
Influenza B by PCR: NEGATIVE
SARS Coronavirus 2 by RT PCR: NEGATIVE

## 2021-12-17 LAB — I-STAT CREATININE, ED: Creatinine, Ser: 1.3 mg/dL — ABNORMAL HIGH (ref 0.44–1.00)

## 2021-12-17 LAB — ETHANOL: Alcohol, Ethyl (B): <10 mg/dL (ref <10)

## 2021-12-17 LAB — APTT: aPTT: 33 seconds (ref 24–36)

## 2021-12-17 MED ORDER — LORAZEPAM 2 MG/ML IJ SOLN
1.0000 mg | Freq: Once | INTRAMUSCULAR | Status: AC
  Administered 2021-12-17: 1 mg via INTRAVENOUS
  Filled 2021-12-17: qty 1

## 2021-12-17 MED ORDER — DIPHENHYDRAMINE HCL 50 MG/ML IJ SOLN: 12.5000 mg | Freq: Three times a day (TID) | INTRAMUSCULAR | Status: DC | PRN

## 2021-12-17 MED ORDER — HYDRALAZINE HCL 20 MG/ML IJ SOLN
5.0000 mg | INTRAMUSCULAR | Status: DC | PRN
  Administered 2021-12-17 – 2021-12-18 (×3): 5 mg via INTRAVENOUS
  Filled 2021-12-17 (×3): qty 1

## 2021-12-17 MED ORDER — ASPIRIN 81 MG PO TBEC
81.0000 mg | DELAYED_RELEASE_TABLET | Freq: Every day | ORAL | Status: DC
  Administered 2021-12-17 – 2021-12-18 (×2): 81 mg via ORAL
  Filled 2021-12-17 (×2): qty 1

## 2021-12-17 MED ORDER — ACETAMINOPHEN 160 MG/5ML PO SOLN: 650.0000 mg | ORAL | Status: DC | PRN

## 2021-12-17 MED ORDER — ONDANSETRON HCL 4 MG/2ML IJ SOLN
4.0000 mg | INTRAMUSCULAR | Status: DC | PRN
  Administered 2021-12-17 – 2021-12-18 (×2): 4 mg via INTRAVENOUS
  Filled 2021-12-17 (×2): qty 2

## 2021-12-17 MED ORDER — SODIUM CHLORIDE 0.9 % IV SOLN: INTRAVENOUS | Status: DC

## 2021-12-17 MED ORDER — ATORVASTATIN CALCIUM 20 MG PO TABS
40.0000 mg | ORAL_TABLET | Freq: Every day | ORAL | Status: DC
  Administered 2021-12-17 – 2021-12-18 (×2): 40 mg via ORAL
  Filled 2021-12-17 (×2): qty 2

## 2021-12-17 MED ORDER — IOHEXOL 350 MG/ML SOLN
75.0000 mL | Freq: Once | INTRAVENOUS | Status: AC | PRN
  Administered 2021-12-17: 75 mL via INTRAVENOUS

## 2021-12-17 MED ORDER — ENOXAPARIN SODIUM 40 MG/0.4ML IJ SOSY: 40.0000 mg | PREFILLED_SYRINGE | INTRAMUSCULAR | Status: DC

## 2021-12-17 MED ORDER — HYDRALAZINE HCL 20 MG/ML IJ SOLN: 10.0000 mg | INTRAMUSCULAR | Status: DC | PRN

## 2021-12-17 MED ORDER — STROKE: EARLY STAGES OF RECOVERY BOOK: Freq: Once | Status: AC

## 2021-12-17 MED ORDER — NICOTINE 21 MG/24HR TD PT24
21.0000 mg | MEDICATED_PATCH | Freq: Every day | TRANSDERMAL | Status: DC
  Administered 2021-12-17 – 2021-12-18 (×2): 21 mg via TRANSDERMAL
  Filled 2021-12-17 (×2): qty 1

## 2021-12-17 MED ORDER — ENOXAPARIN SODIUM 60 MG/0.6ML IJ SOSY: 50.0000 mg | PREFILLED_SYRINGE | INTRAMUSCULAR | Status: DC

## 2021-12-17 MED ORDER — SENNOSIDES-DOCUSATE SODIUM 8.6-50 MG PO TABS: 1.0000 | ORAL_TABLET | Freq: Every evening | ORAL | Status: DC | PRN

## 2021-12-17 MED ORDER — ACETAMINOPHEN 325 MG PO TABS: 650.0000 mg | ORAL_TABLET | ORAL | Status: DC | PRN

## 2021-12-17 MED ORDER — ACETAMINOPHEN 650 MG RE SUPP: 650.0000 mg | RECTAL | Status: DC | PRN

## 2021-12-17 NOTE — ED Notes (Signed)
Clyde Lundborg, MD aware of patient's BP of 229/110.

## 2021-12-17 NOTE — H&P (Addendum)
History and Physical    Sheila CharLisa A Mcclenton WUJ:[id number] DOB: 05-26-1950 DOA: 12/17/2021  Referring MD/NP/PA:   PCP: Lynnea FerrierKlein, Bert J III, MD   Patient coming from:  The patient is coming from home.  At baseline, pt is independent for most of ADL.        Chief Complaint: Right-sided weakness and numbness  HPI: Sheila Fleming is a 71 y.o. female with medical history significant of migraine headache, bilateral cataracts, tobacco abuse, PVD, who presents with right-sided weakness and numbness.  Pt was last known normal at about 3:30 AM when got up to go to the bathroom and fell normal. At about 7:30, when she woke up, and noticed that she was having trouble getting to the bathroom due to right-sided weakness and numbness.  She also has mild difficulty speaking.  She reports mild blurry vision in the right eye which he attributes to cataract. She does not have chest pain, cough, shortness of breath.  No nausea, vomiting, diarrhea or abdominal pain.  Symptoms of UTI for no fever or chills.  Data reviewed independently and ED Course: pt was found to have WBC 8.4, INR 1.0, PTT 33, AKI with creatinine 1.30, BUN 25, GFR 51 (no baseline creatinine available), temperature normal, blood pressure 127/116, heart rate 74, RR 18, oxygen saturation 97% on room air. Patient is placed on telemetry bed for observation. Dr. Amada JupiterKirkpatrick of neurology is consulted.  CT-head: Acute or subacute left PCA territory infarct with possible petechial hemorrhage. No mass occupying acute hemorrhage. No significant mass effect or midline shift.  EKG: I have personally reviewed.  Sinus rhythm, QTc 500, possible left atrial enlargement, early R wave progression, unstable baseline.   Review of Systems:   General: no fevers, chills, no body weight gain, has fatigue HEENT: no blurry vision, hearing changes or sore throat Respiratory: no dyspnea, coughing, wheezing CV: no chest pain, no palpitations GI: no nausea, vomiting, abdominal  pain, diarrhea, constipation GU: no dysuria, burning on urination, increased urinary frequency, hematuria  Ext: no leg edema Neuro: Has right-sided weakness and numbness, has right eye blurry vision, has mild difficulty speaking Skin: no rash, no skin tear. MSK: No muscle spasm, no deformity, no limitation of range of movement in spin Heme: No easy bruising.  Travel history: No recent long distant travel.   Allergy:  Allergies  Allergen Reactions   Sulfa Antibiotics     Past Medical History:  Diagnosis Date   Anemia    Cataracts, bilateral    Cough    sinus related   Headache(784.0)    migraines   Peripheral vascular disease (HCC)    hx phlebitis   Tobacco abuse     Past Surgical History:  Procedure Laterality Date   ABDOMINAL HYSTERECTOMY     KNEE ARTHROSCOPY  11/26/2011   Procedure: ARTHROSCOPY KNEE;  Surgeon: Jacki Conesonald A Gioffre, MD;  [facility];  Service: Orthopedics;  Laterality: Left;  WITH MENISECTOMY    Social History:  reports that she has been smoking cigarettes. She has never used smokeless tobacco. She reports that she does not drink alcohol and does not use drugs.  Family History:  Family History  Problem Relation Age of Onset   Diabetes Mother    Colon cancer Mother      Prior to Admission medications   Medication Sig Start Date End Date Taking? Authorizing Provider  b complex vitamins tablet Take 1 tablet by mouth daily.    [provider]  calcium  carbonate (OS-CAL) 600 MG TABS Take 600 mg by mouth daily.    [provider]  PRENAT-FECBN-FEBISG-FA-FISHOIL PO Take 1,000 mg by mouth daily.    [provider]    Physical Exam: Vitals:   12/17/21 1300 12/17/21 1330 12/17/21 1430 12/17/21 1431  BP: (!) 153/108 (!) 180/99 (!) 214/126 (!) 229/100  Pulse: 74 73 73 76  Resp: (!) 27 15 (!) 22 18  Temp:      TempSrc:      SpO2: 99% 99% 99% 98%  Weight:      Height:       General: Not in acute  distress HEENT:       Eyes: PERRL, EOMI, no scleral icterus.       ENT: No discharge from the ears and nose, no pharynx injection, no tonsillar enlargement.        Neck: No JVD, no bruit, no mass felt. Heme: No neck lymph node enlargement. Cardiac: S1/S2, RRR, No murmurs, No gallops or rubs. Respiratory: No rales, wheezing, rhonchi or rubs. GI: Soft, nondistended, nontender, no rebound pain, no organomegaly, BS present. GU: No hematuria Ext: No pitting leg edema bilaterally. 1+DP/PT pulse bilaterally. Musculoskeletal: No joint deformities, No joint redness or warmth, no limitation of ROM in spin. Skin: No rashes.  Neuro: Alert, oriented X3, cranial nerves II-XII grossly intact.  Muscle strength is decreased minimally in the right arm and leg compared with right extremities. Sensation to light touch intact. Brachial reflex 2+ bilaterally. Psych: Patient is not psychotic, no suicidal or hemocidal ideation.  Labs on Admission: I have personally reviewed following labs and imaging studies  CBC: Recent Labs  Lab 12/17/21 1204  WBC 8.4  NEUTROABS 5.9  HGB 14.9  HCT 43.8  MCV 89.4  PLT 247   Basic Metabolic Panel: Recent Labs  Lab 12/17/21 1204 12/17/21 1213  NA 140  --   K 4.2  --   CL 107  --   CO2 25  --   GLUCOSE 108*  --   BUN 16  --   CREATININE 1.15* 1.30*  CALCIUM 9.5  --    GFR: Estimated Creatinine Clearance: 49.9 mL/min (A) (by C-G formula based on SCr of 1.3 mg/dL (H)). Liver Function Tests: Recent Labs  Lab 12/17/21 1204  AST 17  ALT 12  ALKPHOS 121  BILITOT 0.7  PROT 7.9  ALBUMIN 4.3   No results for input(s): "LIPASE", "AMYLASE" in the last 168 hours. No results for input(s): "AMMONIA" in the last 168 hours. Coagulation Profile: Recent Labs  Lab 12/17/21 1204  INR 1.0   Cardiac Enzymes: No results for input(s): "CKTOTAL", "CKMB", "CKMBINDEX", "TROPONINI" in the last 168 hours. BNP (last 3 results) No results for input(s): "PROBNP" in the last  8760 hours. HbA1C: No results for input(s): "HGBA1C" in the last 72 hours. CBG: Recent Labs  Lab 12/17/21 1200  GLUCAP 110*   Lipid Profile: No results for input(s): "CHOL", "HDL", "LDLCALC", "TRIG", "CHOLHDL", "LDLDIRECT" in the last 72 hours. Thyroid Function Tests: No results for input(s): "TSH", "T4TOTAL", "FREET4", "T3FREE", "THYROIDAB" in the last 72 hours. Anemia Panel: No results for input(s): "VITAMINB12", "FOLATE", "FERRITIN", "TIBC", "IRON", "RETICCTPCT" in the last 72 hours. Urine analysis:    Component Value Date/Time   COLORURINE STRAW (A) 12/17/2021 1231   APPEARANCEUR CLEAR (A) 12/17/2021 1231   LABSPEC 1.003 (L) 12/17/2021 1231   PHURINE 6.0 12/17/2021 1231   GLUCOSEU NEGATIVE 12/17/2021 1231   HGBUR NEGATIVE 12/17/2021 1231   BILIRUBINUR  NEGATIVE 12/17/2021 1231   KETONESUR NEGATIVE 12/17/2021 1231   PROTEINUR NEGATIVE 12/17/2021 1231   NITRITE NEGATIVE 12/17/2021 1231   LEUKOCYTESUR NEGATIVE 12/17/2021 1231   Sepsis Labs: @LABRCNTIP (procalcitonin:4,lacticidven:4) ) Recent Results (from the past 240 hour(s))  Resp Panel by RT-PCR (Flu A&B, Covid) Anterior Nasal Swab     Status: None   Collection Time: 12/17/21 12:31 PM   Specimen: Anterior Nasal Swab  Result Value Ref Range Status   SARS Coronavirus 2 by RT PCR NEGATIVE NEGATIVE Final    Comment: (NOTE) SARS-CoV-2 target nucleic acids are NOT DETECTED.  The SARS-CoV-2 RNA is generally detectable in upper respiratory specimens during the acute phase of infection. The lowest concentration of SARS-CoV-2 viral copies this assay can detect is 138 copies/mL. A negative result does not preclude SARS-Cov-2 infection and should not be used as the sole basis for treatment or other patient management decisions. A negative result may occur with  improper specimen collection/handling, submission of specimen other than nasopharyngeal swab, presence of viral mutation(s) within the areas targeted by this assay, and  inadequate number of viral copies(<138 copies/mL). A negative result must be combined with clinical observations, patient history, and epidemiological information. The expected result is Negative.  Fact Sheet for Patients:  02/16/22  Fact Sheet for Healthcare Providers:  BloggerCourse.com  This test is no t yet approved or cleared by the [employer]ious[employer].[place] FDA and  has been authorized for detection and/or diagnosis of SARS-CoV-2 by FDA under an Emergency Use Authorization (EUA). This EUA will remain  in effect (meaning this test can be used) for the duration of the COVID-19 declaration under Section 564(b)(1) of the Act, 21 U.S.C.section 360bbb-3(b)(1), unless the authorization is terminated  or revoked sooner.       Influenza A by PCR NEGATIVE NEGATIVE Final   Influenza B by PCR NEGATIVE NEGATIVE Final    Comment: (NOTE) The Xpert Xpress SARS-CoV-2/FLU/RSV plus assay is intended as an aid in the diagnosis of influenza from Nasopharyngeal swab specimens and should not be used as a sole basis for treatment. Nasal washings and aspirates are unacceptable for Xpert Xpress SARS-CoV-2/FLU/RSV testing.  Fact Sheet for Patients: [place]  Fact Sheet for Healthcare Providers: BloggerCourse.com  This test is not yet approved or cleared by the [employer]ious[employer]ker.it FDA and has been authorized for detection and/or diagnosis of SARS-CoV-2 by FDA under an Emergency Use Authorization (EUA). This EUA will remain in effect (meaning this test can be used) for the duration of the COVID-19 declaration under Section 564(b)(1) of the Act, 21 U.S.C. section 360bbb-3(b)(1), unless the authorization is terminated or revoked.  Performed at [facility], [address] [place], [place]      Radiological Exams on Admission: MR BRAIN WO CONTRAST  Result Date:  12/17/2021 CLINICAL DATA:  Stroke, follow up; Neuro deficit, acute, stroke suspected EXAM: MRI HEAD WITHOUT CONTRAST MRA HEAD WITHOUT CONTRAST TECHNIQUE: Multiplanar, multi-echo pulse sequences of the brain and surrounding structures were acquired without intravenous contrast. Angiographic images of the Circle of Willis were acquired using MRA technique without intravenous contrast. COMPARISON:  None Available. FINDINGS: MRI HEAD FINDINGS Brain: Acute left PCA territory infarct, including restricted diffusion in the left occipital lobe, left hippocampus, and left thalamocapsular region. Associated edema without significant mass effect or midline shift. Trace curvilinear T1 hyperintense in this region could represent trace subarachnoid hemorrhage and/or petechial hemorrhage. No mass occupying acute hemorrhage. No hydrocephalus or mass lesion. In Vascular: See below. Skull and upper cervical spine: Normal  marrow signal. Sinuses/Orbits: Largely clear sinuses.  No acute orbital findings. MRA HEAD FINDINGS Moderate to severely motion limited study. Anterior circulation: Bilateral intracranial ICAs and proximal MCAs appear grossly patent. 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 The ACAs are otherwise patent proximally. Nondiagnostic evaluation for stenosis. Posterior circulation: Moderate to severely motion limited study. The P1 and proximal P2 PCAs are patent. The mid and distal left P2 PCA flow void is not visualized, raising concern for possible stenosis or occlusion; however, artifact makes it difficult to confirm. IMPRESSION: MRI: 1. Acute left PCA territory infarct, detailed above. 2. Trace curvilinear T1 hyperintense in this region could represent trace subarachnoid hemorrhage and/or petechial hemorrhage. No mass occupying acute hemorrhage. MRA: Moderate to severely motion limited study. The P1 and proximal P2 PCAs are patent. The mid and distal left P2 PCA flow void is not visualized, raising concern  for possible stenosis or occlusion; however, artifact makes it difficult to confirm. A CTA could probably better evaluate if clinically warranted. Electronically Signed   By: Feliberto Harts M.D.   On: 12/17/2021 14:36   MR ANGIO HEAD WO CONTRAST  Result Date: 12/17/2021 CLINICAL DATA:  Stroke, follow up; Neuro deficit, acute, stroke suspected EXAM: MRI HEAD WITHOUT CONTRAST MRA HEAD WITHOUT CONTRAST TECHNIQUE: Multiplanar, multi-echo pulse sequences of the brain and surrounding structures were acquired without intravenous contrast. Angiographic images of the Circle of Willis were acquired using MRA technique without intravenous contrast. COMPARISON:  None Available. FINDINGS: MRI HEAD FINDINGS Brain: Acute left PCA territory infarct, including restricted diffusion in the left occipital lobe, left hippocampus, and left thalamocapsular region. Associated edema without significant mass effect or midline shift. Trace curvilinear T1 hyperintense in this region could represent trace subarachnoid hemorrhage and/or petechial hemorrhage. No mass occupying acute hemorrhage. No hydrocephalus or mass lesion. In Vascular: See below. Skull and upper cervical spine: Normal marrow signal. Sinuses/Orbits: Largely clear sinuses.  No acute orbital findings. MRA HEAD FINDINGS Moderate to severely motion limited study. Anterior circulation: Bilateral intracranial ICAs and proximal MCAs appear grossly patent. 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 The ACAs are otherwise patent proximally. Nondiagnostic evaluation for stenosis. Posterior circulation: Moderate to severely motion limited study. The P1 and proximal P2 PCAs are patent. The mid and distal left P2 PCA flow void is not visualized, raising concern for possible stenosis or occlusion; however, artifact makes it difficult to confirm. IMPRESSION: MRI: 1. Acute left PCA territory infarct, detailed above. 2. Trace curvilinear T1 hyperintense in this region could  represent trace subarachnoid hemorrhage and/or petechial hemorrhage. No mass occupying acute hemorrhage. MRA: Moderate to severely motion limited study. The P1 and proximal P2 PCAs are patent. The mid and distal left P2 PCA flow void is not visualized, raising concern for possible stenosis or occlusion; however, artifact makes it difficult to confirm. A CTA could probably better evaluate if clinically warranted. Electronically Signed   By: Feliberto Harts M.D.   On: 12/17/2021 14:36   CT HEAD CODE STROKE WO CONTRAST  Result Date: 12/17/2021 CLINICAL DATA:  Code stroke. EXAM: CT HEAD WITHOUT CONTRAST TECHNIQUE: Contiguous axial images were obtained from the base of the skull through the vertex without intravenous contrast. RADIATION DOSE REDUCTION: This exam was performed according to the departmental dose-optimization program which includes automated exposure control, adjustment of the mA and/or kV according to patient size and/or use of iterative reconstruction technique. COMPARISON:  None Available. FINDINGS: Brain: Hypoattenuation and loss of gray-white differentiation in the left PCA territory including  the occipital lobe and left hippocampus. Mild intermixed hyperdensity may represent petechial hemorrhage versus spared cortex. No mass occupying acute hemorrhage. No significant mass effect or midline shift. No hydrocephalus or mass lesion. Vascular: No hyperdense vessel identified. Skull: No acute fracture. Sinuses/Orbits: Clear sinuses.  No acute orbital findings. Other: No mastoid effusions IMPRESSION: Acute or subacute left PCA territory infarct with possible petechial hemorrhage. No mass occupying acute hemorrhage. No significant mass effect or midline shift. Code stroke imaging results were communicated on 12/17/2021 at 12:19 pm to provider Dr. Amada Jupiter via telephone, who verbally acknowledged these results. Electronically Signed   By: Feliberto Harts M.D.   On: 12/17/2021 12:21       Assessment/Plan Principal Problem:   Stroke [facility]) Active Problems:   AKI (acute kidney injury) (HCC)   Tobacco abuse   Hypertension   Assessment and Plan: * Stroke (HCC) CT-head showed acute or subacute left PCA territory infarct with possible petechial hemorrhage.  Dr. Amada Jupiter of neurology was consulted, recommended stroke work-up.  -Placed on tele med bed for observation - Obtain MRI-brain and MRA of head and neck - ASA 81 mg daily - Start Lipitor 40 mg daily - fasting lipid panel and HbA1c  - 2D transthoracic echocardiography  - swallowing screen. If fails, will get SLP - Check UDS   Addendum: MRI-brain showed Acute left PCA territory infarct and trace curvilinear T1 hyperintense in this region could represent trace subarachnoid hemorrhage and/or petechial hemorrhage. I discussed the findings with with Dr. Amada Jupiter. Per Dr. Amada Jupiter, no need to consult neurosurgery.  It is okay to continue aspirin 81 mg daily.  MRI:  1. Acute left PCA territory infarct, detailed above. 2. Trace curvilinear T1 hyperintense in this region could represent trace subarachnoid hemorrhage and/or petechial hemorrhage. No mass occupying acute hemorrhage.   MRA:  Moderate to severely motion limited study. The P1 and proximal P2 PCAs are patent. The mid and distal left P2 PCA flow void is not visualized, raising concern for possible stenosis or occlusion; however, artifact makes it difficult to confirm. A CTA could probably better evaluate if clinically warranted  AKI (acute kidney injury) (HCC) Patient has mild AKI with creatinine 1.30, BUN 25, GFR 51, no baseline creatinine available. -IV fluid: 100 cc/h of normal saline for 8 hours -Follow-up with BMP  Tobacco abuse - Nicotine patch -Did counseling about importance of quitting smoking.  Her husband promised to help her quitting smoking  Hypertension Addendum: Pt dose not hx of HTN, her bp is 127/116 initially, then  increased to 180/99 and 229/100. -will allow permissive hypertension -will start prn IV hydralazine 5 mg q2h prn for SBP> 220 and dBP> 110.          DVT ppx: SCD  Code Status: Full code  Family Communication: I called her husband  Disposition Plan:  Anticipate discharge back to previous environment  Consults called: Dr. Amada Jupiter for neurology  Admission status and Level of care: Telemetry Medical:    for obs    Dispo: The patient is from: Home              Anticipated d/c is to: Home              Anticipated d/c date is: 1 day              Patient currently is not medically stable to d/c.    Severity of Illness:  The appropriate patient status for this patient is OBSERVATION. Observation status is judged  to be reasonable and necessary in order to provide the required intensity of service to ensure the patient's safety. The patient's presenting symptoms, physical exam findings, and initial radiographic and laboratory data in the context of their medical condition is felt to place them at decreased risk for further clinical deterioration. Furthermore, it is anticipated that the patient will be medically stable for discharge from the hospital within 2 midnights of admission.        Date of Service 12/17/2021    Lorretta Harp [facility]   If 7PM-7AM, please contact night-coverage [email or web address] 12/17/2021, 2:52 PM

## 2021-12-17 NOTE — ED Notes (Signed)
Patient completing MRI screening at this time. 

## 2021-12-17 NOTE — ED Notes (Signed)
Pt transported to MRI 

## 2021-12-17 NOTE — Assessment & Plan Note (Signed)
Patient has mild AKI with creatinine 1.30, BUN 25, GFR 51, no baseline creatinine available. -IV fluid: 100 cc/h of normal saline for 8 hours -Follow-up with BMP

## 2021-12-17 NOTE — Assessment & Plan Note (Addendum)
-   Nicotine patch -Did counseling about importance of quitting smoking.  Her husband promised to help her quitting smoking

## 2021-12-17 NOTE — Assessment & Plan Note (Addendum)
Addendum: Pt dose not hx of HTN, her bp is 127/116 initially, then increased to 180/99 and 229/100. -will allow permissive hypertension -will start prn IV hydralazine 5 mg q2h prn for SBP> 220 and dBP> 110.

## 2021-12-17 NOTE — ED Triage Notes (Signed)
Patient to ED via [facility] from home as a Code Stroke. Patient [facility]- 0330. Right sided numbness/weakness with visual deficits on the right side.   1150- Kirkpatrick at [facility] [employer] awaiting patient arrival.   1201- Patient to CT with this RN and neurologist.

## 2021-12-17 NOTE — Assessment & Plan Note (Addendum)
CT-head showed acute or subacute left PCA territory infarct with possible petechial hemorrhage.  Dr. Amada Jupiter of neurology was consulted, recommended stroke work-up.  -Placed on tele med bed for observation - Obtain MRI-brain and MRA of head and neck - ASA 81 mg daily - Start Lipitor 40 mg daily - fasting lipid panel and HbA1c  - 2D transthoracic echocardiography  - swallowing screen. If fails, will get SLP - Check UDS   Addendum: MRI-brain showed Acute left PCA territory infarct and trace curvilinear T1 hyperintense in this region could represent trace subarachnoid hemorrhage and/or petechial hemorrhage. I discussed the findings with with Dr. Amada Jupiter. Per Dr. Amada Jupiter, no need to consult neurosurgery.  It is okay to continue aspirin 81 mg daily.  MRI: 1. Acute left PCA territory infarct, detailed above. 2. Trace curvilinear T1 hyperintense in this region could represent trace subarachnoid hemorrhage and/or petechial hemorrhage. No mass occupying acute hemorrhage.  MRA: Moderate to severely motion limited study. The P1 and proximal P2 PCAs are patent. The mid and distal left P2 PCA flow void is not visualized, raising concern for possible stenosis or occlusion; however, artifact makes it difficult to confirm. A CTA could probably better evaluate if clinically warranted

## 2021-12-17 NOTE — ED Provider Notes (Signed)
[facility] Provider Note    Event Date/Time   First MD Initiated Contact with Patient 12/17/21 1202     (approximate)   History   Code Stroke   HPI  Sheila Fleming is a 71 y.o. female   Here as stroke code last known normal 330A, with symptoms R sided numbness and vision changes.  Denies trauma, no recent illness, no chest pain.  No PMH though the patient does not often see the doctor.   History was obtained via patient.      Physical Exam   Triage Vital Signs: ED Triage Vitals  Enc Vitals Group     BP      Pulse      Resp      Temp      Temp src      SpO2      Weight      Height      Head Circumference      Peak Flow      Pain Score      Pain Loc      Pain Edu?      Excl. in GC?     Most recent vital signs: Vitals:   12/17/21 1300 12/17/21 1330  BP: (!) 153/108 (!) 180/99  Pulse: 74 73  Resp: (!) 27 15  Temp:    SpO2: 99% 99%    General: Awake, no distress.  CV:  Good peripheral perfusion. Radial pulses intact bilaterally Resp:  Normal effort.  Abd:  No distention. 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 R hemianopia. Alert and oriented.     ED Results / Procedures / Treatments   Labs (all labs ordered are listed, but only abnormal results are displayed) Labs Reviewed  COMPREHENSIVE METABOLIC PANEL - Abnormal; Notable for the following components:      Result Value   Glucose, Bld 108 (*)    Creatinine, Ser 1.15 (*)    GFR, Estimated 51 (*)    All other components within normal limits  URINALYSIS, ROUTINE W REFLEX MICROSCOPIC - Abnormal; Notable for the following components:   Color, Urine STRAW (*)    APPearance CLEAR (*)    Specific Gravity, Urine 1.003 (*)    All other components within normal limits  CBG MONITORING, ED - Abnormal; Notable for the following components:   Glucose-Capillary 110 (*)    All other  components within normal limits  I-STAT CREATININE, ED - Abnormal; Notable for the following components:   Creatinine, Ser 1.30 (*)    All other components within normal limits  RESP PANEL BY RT-PCR (FLU A&B, COVID) ARPGX2  PROTIME-INR  APTT  CBC  DIFFERENTIAL  URINE DRUG SCREEN, QUALITATIVE ([facility] ONLY)  ETHANOL  HIV ANTIBODY (ROUTINE TESTING W REFLEX)  HEMOGLOBIN A1C  I-STAT CREATININE (MANUAL ENTRY)     I reviewed labs and they are notable for glucose 108  EKG  ED ECG REPORT I, Pilar Jarvis, the attending physician, personally viewed and interpreted this ECG.   Date: 12/17/2021  EKG Time: 1227  Rate: 75  Rhythm: normal sinus rhythm  Axis: normal  Intervals: QTC 500 prolonged, no ischemic changes   PROCEDURES:  Critical Care performed: Yes, see critical care procedure note(s)  .Critical Care  Performed by: Pilar Jarvis, MD Authorized by: Pilar Jarvis, MD   Critical care provider statement:    Critical care time (minutes):  30   Critical care was necessary to treat or prevent imminent or life-threatening deterioration of the following conditions:  CNS failure or compromise   Critical care was time spent personally by me on the following activities:  Ordering and performing treatments and interventions, ordering and review of laboratory studies, development of treatment plan with patient or surrogate, discussions with consultants, evaluation of patient's response to treatment, re-evaluation of patient's condition, review of old charts and examination of patient   Care discussed with: admitting provider      MEDICATIONS ORDERED IN ED: Medications  aspirin EC tablet 81 mg (81 mg Oral Given 12/17/21 1343)  atorvastatin (LIPITOR) tablet 40 mg (40 mg Oral Given 12/17/21 1342)  nicotine (NICODERM CQ - dosed in mg/24 hours) patch 21 mg (has no administration in time range)  diphenhydrAMINE (BENADRYL) injection 12.5 mg (has no administration in time range)  0.9 %  sodium  chloride infusion (has no administration in time range)   stroke: early stages of recovery book (has no administration in time range)  acetaminophen (TYLENOL) tablet 650 mg (has no administration in time range)    Or  acetaminophen (TYLENOL) 160 MG/5ML solution 650 mg (has no administration in time range)    Or  acetaminophen (TYLENOL) suppository 650 mg (has no administration in time range)  senna-docusate (Senokot-S) tablet 1 tablet (has no administration in time range)  iohexol (OMNIPAQUE) 350 MG/ML injection 75 mL (75 mLs Intravenous Contrast Given 12/17/21 1212)  LORazepam (ATIVAN) injection 1 mg (1 mg Intravenous Given 12/17/21 1339)    Consultants:  I spoke with neurology consultant Dr Amada Jupiter regarding care plan for this patient.   IMPRESSION / MDM / ASSESSMENT AND PLAN / ED COURSE  I reviewed the triage vital signs and the nursing notes.                              Differential diagnosis includes, but is not limited to, CVA, intracranial bleeding, seizure, hypoglycemia.   Stroke code called and neuro at bedside, glucose WNL and patient protecting airway, directly to CT scanner. Stroke labs and imaging ordered.   L PCA infarct; per neurology not thrombolytic/thrombectomy candidate. Admit to hospitalist in stable condition.    The patient is on the cardiac monitor to evaluate for evidence of arrhythmia and/or significant heart rate changes.   Patient's presentation is most consistent with acute presentation with potential threat to life or bodily function.       FINAL CLINICAL IMPRESSION(S) / ED DIAGNOSES   Final diagnoses:  Acute ischemic stroke (HCC)     Rx / DC Orders   ED Discharge Orders     None        Note:  This document was prepared using Dragon voice recognition software and may include unintentional dictation errors.    Pilar Jarvis, MD 12/17/21 1434

## 2021-12-17 NOTE — Consult Note (Signed)
Neurology Consultation Reason for Consult: Stroke Referring Physician: Nash Dimmer  CC: Visual change  History is obtained from: Patient  HPI: Sheila Fleming is a 71 y.o. female who does not see physicians on a regular basis who presents with numbness that started on awakening this morning.  She states that she got up to go to the bathroom around 330, and then when she woke up at 730, she noticed that she was having trouble getting to the bathroom due to numbness on the right side.  Due to this EMS was called, who activated a code stroke with the last known well reported at 730   LKW: 3:30 AM tnk given?: no, outside of window  Past Medical History:  Diagnosis Date   Anemia    Cataracts, bilateral    Cough    sinus related   Headache(784.0)    migraines   Peripheral vascular disease (HCC)    hx phlebitis     No family history on file.   Social History:  reports that she has been smoking cigarettes. She does not have any smokeless tobacco history on file. She reports that she does not drink alcohol. No history on file for drug use.   Exam: Current vital signs: There were no vitals taken for this visit. Vital signs in last 24 hours:     Physical Exam  Constitutional: Appears well-developed and well-nourished.  Psych: Affect appropriate to situation Eyes: No scleral injection HENT: No OP obstruction MSK: no joint deformities.  Cardiovascular: Normal rate and regular rhythm.  Respiratory: Effort normal, non-labored breathing GI: Soft.  No distension. There is no tenderness.  Skin: WDI  Neuro: Mental Status: Patient is awake, alert, oriented to person, place, month, year, and situation. Patient is able to give a clear and coherent history. No signs of aphasia or neglect Cranial Nerves: II: She has a dense right hemianopia. Pupils are equal, round, and reactive to light.   III,IV, VI: EOMI  V: Facial sensation is symmetric to temperature VII: Facial movement is notable  for left peripheral distribution facial weakness (old from a previous Bell's palsy) VIII: hearing is intact to voice X: Uvula elevates symmetrically XI: Shoulder shrug is symmetric. XII: tongue is midline without atrophy or fasciculations.  Motor: She has good strength to confrontation, but does have mild drift in the right upper extremity, no drift in the lowers Sensory: Sensation is diminished in the left arm and leg Cerebellar: She has mild discoordination on finger-nose-finger on the right     I have reviewed labs in epic and the results pertinent to this consultation are: Creatinine 1.3  I have reviewed the images obtained: CT head-established PCA stroke  Impression: 71 year old female with well-established PCA stroke on the left, which could be embolic or thrombotic.  Given some concern for petechial hemorrhage, I would favor not doing dual antiplatelet therapy, but will continue daily aspirin.  She will need admission for secondary risk factor assessment, modification, and therapy evaluations.  Recommendations: 1) ASA 81 mg daily 2) echo 3) MRI brain 4) MR angiogram of the head and neck 5) lipids, A1c 6) PT, OT, ST 7) neurochecks 8) I counseled the patient on smoking cessation 9) neurology will be available as needed  Ritta Slot, MD [facility] [phone]  If 7pm- 7am, please page neurology on call as listed in AMION.

## 2021-12-17 NOTE — Progress Notes (Signed)
Yellow MEWS triggered r/t elevated BP. Patient followed by neurology this admission and per hospitalist note will allow permissive hypertension up to 220 systolic. PRN given; see MAR.     12/17/21 2045  Assess: MEWS Score  Temp (!) 97.5 F (36.4 C)  BP (!) 222/92  MAP (mmHg) 126  Pulse Rate 71  Resp 18  SpO2 99 %  O2 Device Room Air  Assess: MEWS Score  MEWS Temp 0  MEWS Systolic 2  MEWS Pulse 0  MEWS RR 0  MEWS LOC 0  MEWS Score 2  MEWS Score Color Yellow  Assess: if the MEWS score is Yellow or Red  Were vital signs taken at a resting state? Yes  Focused Assessment No change from prior assessment (allowing permissive hypertension at this time)  Does the patient meet 2 or more of the SIRS criteria? No  MEWS guidelines implemented *See Row Information* Yes  Treat  MEWS Interventions Administered prn meds/treatments  Pain Score 0  Take Vital Signs  Increase Vital Sign Frequency  Yellow: Q 2hr X 2 then Q 4hr X 2, if remains yellow, continue Q 4hrs  Escalate  MEWS: Escalate Yellow: discuss with charge nurse/RN and consider discussing with provider and RRT  Notify: Charge Nurse/RN  Name of Charge Nurse/RN Notified Toniann Ket, RN  Date Charge Nurse/RN Notified 12/17/21  Time Charge Nurse/RN Notified 2048  Document  Patient Outcome Other (Comment) (see note)  Progress note created (see row info) Yes  Assess: SIRS CRITERIA  SIRS Temperature  0  SIRS Pulse 0  SIRS Respirations  0  SIRS WBC 1  SIRS Score Sum  1

## 2021-12-17 NOTE — Progress Notes (Signed)
   12/17/21 1234  Clinical Encounter Type  Visited With Patient and family together  Visit Type Initial;Code  Referral From Nurse  Consult/Referral To Chaplain   Chaplain responded to code stroke. Waited for and accompanied patient's husband to ED rooms 18.

## 2021-12-18 ENCOUNTER — Observation Stay
Admit: 2021-12-18 | Discharge: 2021-12-18 | Disposition: A | Discharge status: Home / Self Care | Payer: Medicare Other | Attending: Internal Medicine | Admitting: Internal Medicine

## 2021-12-18 ENCOUNTER — Observation Stay: Payer: Medicare Other

## 2021-12-18 DIAGNOSIS — N179 Acute kidney failure, unspecified: Secondary | ICD-10-CM | POA: Diagnosis not present

## 2021-12-18 DIAGNOSIS — I6502 Occlusion and stenosis of left vertebral artery: Secondary | ICD-10-CM

## 2021-12-18 DIAGNOSIS — I639 Cerebral infarction, unspecified: Secondary | ICD-10-CM | POA: Diagnosis not present

## 2021-12-18 DIAGNOSIS — I63212 Cerebral infarction due to unspecified occlusion or stenosis of left vertebral arteries: Secondary | ICD-10-CM | POA: Diagnosis not present

## 2021-12-18 LAB — ECHOCARDIOGRAM COMPLETE
Height: 66 in
S' Lateral: 3.3 cm
Weight: 3781.33 oz

## 2021-12-18 LAB — BASIC METABOLIC PANEL
Anion gap: 7 (ref 5–15)
BUN: 13 mg/dL (ref 8–23)
CO2: 24 mmol/L (ref 22–32)
Calcium: 9 mg/dL (ref 8.9–10.3)
Chloride: 109 mmol/L (ref 98–111)
Creatinine, Ser: 1.07 mg/dL — ABNORMAL HIGH (ref 0.44–1.00)
GFR, Estimated: 56 mL/min — ABNORMAL LOW (ref >60)
Glucose, Bld: 100 mg/dL — ABNORMAL HIGH (ref 70–99)
Potassium: 3.8 mmol/L (ref 3.5–5.1)
Sodium: 140 mmol/L (ref 135–145)

## 2021-12-18 LAB — LIPID PANEL
Cholesterol: 253 mg/dL — ABNORMAL HIGH (ref 0–200)
HDL: 30 mg/dL — ABNORMAL LOW (ref >40)
LDL Cholesterol: 179 mg/dL — ABNORMAL HIGH (ref 0–99)
Total CHOL/HDL Ratio: 8.4 RATIO
Triglycerides: 222 mg/dL — ABNORMAL HIGH (ref <150)
VLDL: 44 mg/dL — ABNORMAL HIGH (ref 0–40)

## 2021-12-18 LAB — HIV ANTIBODY (ROUTINE TESTING W REFLEX): HIV Screen 4th Generation wRfx: NONREACTIVE

## 2021-12-18 MED ORDER — ATORVASTATIN CALCIUM 20 MG PO TABS: 80.0000 mg | ORAL_TABLET | Freq: Every day | ORAL | Status: DC

## 2021-12-18 MED ORDER — CLOPIDOGREL BISULFATE 75 MG PO TABS
75.0000 mg | ORAL_TABLET | Freq: Every day | ORAL | Status: DC
  Administered 2021-12-18: 75 mg via ORAL
  Filled 2021-12-18: qty 1

## 2021-12-18 MED ORDER — CLOPIDOGREL BISULFATE 75 MG PO TABS: 75.0000 mg | ORAL_TABLET | Freq: Every day | ORAL | 0 refills | Status: AC

## 2021-12-18 MED ORDER — ASPIRIN 81 MG PO TBEC: 81.0000 mg | DELAYED_RELEASE_TABLET | Freq: Every day | ORAL | 0 refills | Status: AC

## 2021-12-18 MED ORDER — ATORVASTATIN CALCIUM 80 MG PO TABS: 80.0000 mg | ORAL_TABLET | Freq: Every day | ORAL | 0 refills | Status: AC

## 2021-12-18 MED ORDER — NICOTINE 21 MG/24HR TD PT24: 21.0000 mg | MEDICATED_PATCH | Freq: Every day | TRANSDERMAL | 0 refills | Status: AC

## 2021-12-18 NOTE — Hospital Course (Signed)
SARRINAH GARDIN is a 71 y.o. female with medical history significant of migraine headache, bilateral cataracts, tobacco abuse, PVD, who presents with right-sided weakness and numbness. Upon arrival to hospital, patient blood pressure was 127/116, CT head showed left PCA infarct.  MRA of the brain confirmed diagnosis.  CT angiogram showed left vertebral stenosis. Patient also had LDL of 179, HDL 30.  Triglyceride 222.  Patient is started on high-dose Lipitor at 80 mg daily, aspirin 81 mg daily and Plavix 75 mg daily.

## 2021-12-18 NOTE — Progress Notes (Signed)
*  PRELIMINARY RESULTS* Echocardiogram 2D Echocardiogram has been performed.  Cristela Blue 12/18/2021, 7:40 AM

## 2021-12-18 NOTE — Progress Notes (Signed)
    Durable Medical Equipment  (From admission, onward)           Start     Ordered   12/18/21 1523  For home use only DME Bedside commode  Once       Comments: 3 in 1  Question:  Patient needs a bedside commode to treat with the following condition  Answer:  Stroke [facility])   12/18/21 1522   12/18/21 1523  For home use only DME Walker  Once       Question:  Patient needs a walker to treat with the following condition  Answer:  Stroke [facility])   12/18/21 1522           Patient is not able to walk the distance required to go the bathroom, or he/she is unable to safely negotiate stairs required to access the bathroom.  A 3in1 BSC will alleviate this problem Angeline Slim, [place] [phone]

## 2021-12-18 NOTE — Progress Notes (Signed)
Subjective: No significant changes, head CT is stable  Exam: Vitals:   12/18/21 0749 12/18/21 0751  BP: (!) 164/90 (!) 164/90  Pulse: 93 98  Resp: 18 18  Temp: 97.9 F (36.6 C) 97.9 F (36.6 C)  SpO2: 96% 97%   Gen: In bed, NAD  Neuro: MS: Awake, alert, interactive and appropriate CN: Dense right hemianopia Motor: Mild right hemiparesis, 4+/5 in the arm and leg Sensory: Diminished light touch in the right  CTA head and neck-occlusion of the mid left PCA, left vertebral origin severe stenosis   Echo with EF 50 to 55%, but relatively normal, no embolic source identified  EKG with normal sinus rhythm  Pertinent Labs: LDL 179 A1c 5.3   Impression: 71 year old female with PCA stroke, likely due to large vessel atherosclerosis.  She did have some concerns for some petechial hemorrhage, so started only on aspirin monotherapy, will repeat head CT today and if stable I would favor adding Plavix for total of 90 days.  Recommendations: 1) head CT 2) if stable, would add Plavix for total of 21 days of dual antiplatelet therapy followed by aspirin monotherapy 3) atorvastatin 80 mg daily 4) PT, OT, ST  Ritta Slot, MD [facility] [phone]  If 7pm- 7am, please page neurology on call as listed in AMION.

## 2021-12-18 NOTE — TOC Transition Note (Signed)
Transition of Care [facility]) - CM/SW Discharge Note   Patient Details  Name: Sheila Fleming MRN: [id number] Date of Birth: [birth year]  [facility] CM/SW Contact:  Gildardo Griffes, LCSW Phone Number: 12/18/2021, 3:28 PM   Clinical Narrative:     Patient to dc today with HH PT OT and RN through Dominic Pea informed of dc today. Patient needing 3in1 and RW, ordered via [employer] to be delivered to hospital room.   No further discharge needs at this time.    Final next level of care: Home w Home Health Services Barriers to Discharge: No Barriers Identified   Patient Goals and CMS Choice Patient states their goals for this hospitalization and ongoing recovery are:: to go home CMS Medicare.gov Compare Post Acute Care list provided to:: Patient Choice offered to / list presented to : Patient  Discharge Placement                       Discharge Plan and Services                DME Arranged: Walker rolling, 3-N-1 DME Agency: [employer] Date DME Agency Contacted: 12/18/21     HH Arranged: PT, OT, RN HH Agency: [facility] Date HH Agency Contacted: 12/18/21 Time HH Agency Contacted: 1528 Representative spoke with at [facility] Agency: Elnita Maxwell  Social Determinants of Health (SDOH) Interventions     Readmission Risk Interventions     No data to display

## 2021-12-18 NOTE — Discharge Summary (Signed)
Physician Discharge Summary   Patient: Sheila Fleming MRN: [id number] DOB: 03-28-1951  Admit date:     12/17/2021  Discharge date: 12/18/21  Discharge Physician: Marrion Coy   PCP: Lynnea Ferrier, MD   Recommendations at discharge:   Follow-up with PCP in 1 week. Follow-up with neurology in 3 weeks.   Discharge Diagnoses: Principal Problem:   Stroke [facility]) Active Problems:   AKI (acute kidney injury) (HCC)   Tobacco abuse   Hypertension   Stenosis of left vertebral artery  Resolved Problems:   * No resolved hospital problems. *  Hospital Course: Sheila Fleming is a 71 y.o. female with medical history significant of migraine headache, bilateral cataracts, tobacco abuse, PVD, who presents with right-sided weakness and numbness. Upon arrival to hospital, patient blood pressure was 127/116, CT head showed left PCA infarct.  MRA of the brain confirmed diagnosis.  CT angiogram showed left vertebral stenosis. Patient also had LDL of 179, HDL 30.  Triglyceride 222.  Patient is started on high-dose Lipitor at 80 mg daily, aspirin 81 mg daily and Plavix 75 mg daily.  Assessment and Plan: Acute ischemic stroke in left PCA distribution. Left vertebral artery stenosis. Dyslipidemia. Stroke work-up confirmed stroke.  There is also left vertebral artery stenosis, discussed with neurology, currently there is no solution to the stenosis.  Patient has a very high LDL level, started on high-dose of Lipitor.  Patient has been seen by PT/OT, recommended home therapies.  Ordered home PT/OT, patient also requested bedside commode. Echocardiogram showed ejection fraction 50 to 55%, without valvular disease. At this point, she is medically stable to be discharged.  Acute kidney injury. Renal function improving after giving fluids.  Essential hypertension. Blood pressures running high, due to acute stroke, I will hold all blood pressure medicines.  Patient need to follow-up with PCP to initiate blood  pressure medicine treatment if blood pressure still running high.  Tobacco abuse. Advised to quit, nicotine patch.      Consultants: Neurology Procedures performed: None  Disposition: Home health Diet recommendation:  Discharge Diet Orders (From admission, onward)     Start     Ordered   12/18/21 0000  Diet - low sodium heart healthy        12/18/21 1502           Cardiac diet DISCHARGE MEDICATION: Allergies as of 12/18/2021       Reactions   Sulfa Antibiotics         Medication List     TAKE these medications    acetaminophen 500 MG tablet Commonly known as: TYLENOL Take 500-1,000 mg by mouth every 6 (six) hours as needed for mild pain or moderate pain.   aspirin EC 81 MG tablet Take 1 tablet (81 mg total) by mouth daily. Swallow whole. Start taking on: December 19, 2021   atorvastatin 80 MG tablet Commonly known as: LIPITOR Take 1 tablet (80 mg total) by mouth daily. Start taking on: December 19, 2021   clopidogrel 75 MG tablet Commonly known as: PLAVIX Take 1 tablet (75 mg total) by mouth daily for 21 days.   nicotine 21 mg/24hr patch Commonly known as: NICODERM CQ - dosed in mg/24 hours Place 1 patch (21 mg total) onto the skin daily. Start taking on: December 19, 2021               Durable Medical Equipment  (From admission, onward)           Start  Ordered   12/18/21 1452  For home use only DME Bedside commode  Once       Question:  Patient needs a bedside commode to treat with the following condition  Answer:  Stroke [facility])   12/18/21 1451            Follow-up Information     Curtis Sites III, MD Follow up in 1 week(s).   Specialty: Internal Medicine Contact information: [address] [place] [address] [phone]         [facility] Follow up in 3 week(s).   Contact information: [address] [place]  [address] [phone]               Discharge Exam: Ceasar Mons Weights   12/17/21 1225  Weight: 107.2 kg   General exam: Appears calm and comfortable  Respiratory system: Clear to auscultation. Respiratory effort normal. Cardiovascular system: S1 & S2 heard, RRR. No JVD, murmurs, rubs, gallops or clicks. No pedal edema. Gastrointestinal system: Abdomen is nondistended, soft and nontender. No organomegaly or masses felt. Normal bowel sounds heard. Central nervous system: Alert and oriented. Left sided weakness Extremities: Symmetric 5 x 5 power. Skin: No rashes, lesions or ulcers Psychiatry: Judgement and insight appear normal. Mood & affect appropriate.    Condition at discharge: good  The results of significant diagnostics from this hospitalization (including imaging, microbiology, ancillary and laboratory) are listed below for reference.   Imaging Studies: CT HEAD WO CONTRAST ( )  Result Date: 12/18/2021 CLINICAL DATA:  71 year old female with confluent left PCA territory infarct. Left P2 occlusion. EXAM: CT HEAD WITHOUT CONTRAST TECHNIQUE: Contiguous axial images were obtained from the base of the skull through the vertex without intravenous contrast. RADIATION DOSE REDUCTION: This exam was performed according to the departmental dose-optimization program which includes automated exposure control, adjustment of the mA and/or kV according to patient size and/or use of iterative reconstruction technique. COMPARISON:  CTA head and neck and brain MRI 12/17/2021.  Sinuses FINDINGS: Brain: Confluent cytotoxic edema in the left PCA territory and left thalamus with mild evolution since yesterday. Petechial hemorrhage is possible but there is no malignant hemorrhagic transformation. No significant mass effect. No midline shift, ventriculomegaly, evidence of mass lesion. Elsewhere gray-white matter differentiation is stable and within normal limits. Vascular: Calcified atherosclerosis at the  skull base. Skull: No acute osseous abnormality identified. Sinuses/Orbits: Visualized paranasal sinuses and mastoids are stable and well aerated. Other: No acute orbit or scalp soft tissue finding. Punctate calcifications or possibly retained metallic foreign bodies at the scalp vertex are stable. IMPRESSION: 1. Expected CT appearance of Left PCA territory infarct. Petechial hemorrhage is possible but there is no malignant hemorrhagic transformation or significant mass effect. 2. No new intracranial abnormality. Electronically Signed   By: Odessa Fleming M.D.   On: 12/18/2021 10:24   ECHOCARDIOGRAM COMPLETE  Result Date: 12/18/2021    ECHOCARDIOGRAM REPORT   Patient Name:   Maryan Char Date of Exam: 12/18/2021 Medical Rec #:  [id number]    Height:       66.0 in Accession #:    [id number]   Weight:       236.3 lb Date of Birth:  [birth year]   BSA:          2.147 m Patient Age:    70 years     BP:           222/112 mmHg Patient Gender: F  HR:           82 bpm. Exam Location:  [facility] Procedure: 2D Echo, Cardiac Doppler and Color Doppler Indications:     Stroke I63.9  History:         Patient has no prior history of Echocardiogram examinations.                  Cough, PVD, tobacco abuse.  Sonographer:     Cristela Blue Referring Phys:  Wynona Neat NIU Diagnosing Phys: Adrian Blackwater  Sonographer Comments: Technically difficult study due to poor echo windows and no apical window. IMPRESSIONS  1. Left ventricular ejection fraction, by estimation, is 50 to 55%. The left ventricle has low normal function. The left ventricle has no regional wall motion abnormalities. Left ventricular diastolic function could not be evaluated.  2. Right ventricular systolic function is normal. The right ventricular size is normal.  3. The mitral valve is grossly normal. No evidence of mitral valve regurgitation.  4. The aortic valve is normal in structure. Aortic valve regurgitation is not visualized. FINDINGS  Left Ventricle: Left ventricular  ejection fraction, by estimation, is 50 to 55%. The left ventricle has low normal function. The left ventricle has no regional wall motion abnormalities. The left ventricular internal cavity size was normal in size. There is no left ventricular hypertrophy. Left ventricular diastolic function could not be evaluated. Right Ventricle: The right ventricular size is normal. No increase in right ventricular wall thickness. Right ventricular systolic function is normal. Left Atrium: Left atrial size was normal in size. Right Atrium: Right atrial size was normal in size. Pericardium: Trivial pericardial effusion is present. The pericardial effusion is anterior to the right ventricle. Mitral Valve: The mitral valve is grossly normal. No evidence of mitral valve regurgitation. Tricuspid Valve: The tricuspid valve is grossly normal. Tricuspid valve regurgitation is not demonstrated. Aortic Valve: The aortic valve is normal in structure. Aortic valve regurgitation is not visualized. Pulmonic Valve: The pulmonic valve was grossly normal. Pulmonic valve regurgitation is not visualized. Aorta: The aortic root, ascending aorta and aortic arch are all structurally normal, with no evidence of dilitation or obstruction. IAS/Shunts: No atrial level shunt detected by color flow Doppler.  LEFT VENTRICLE PLAX 2D LVIDd:         4.50 cm LVIDs:         3.30 cm LV PW:         1.30 cm LV IVS:        1.40 cm LVOT diam:     2.00 cm LVOT Area:     3.14 cm  LEFT ATRIUM         Index LA diam:    3.80 cm 1.77 cm/m   AORTA Ao Root diam: 2.50 cm TRICUSPID VALVE TR Peak grad:   14.9 mmHg TR Vmax:        193.00 cm/s  SHUNTS Systemic Diam: 2.00 cm Adrian Blackwater Electronically signed by Adrian Blackwater Signature Date/Time: 12/18/2021/9:09:35 AM    Final    CT ANGIO HEAD NECK W WO CM  Result Date: 12/17/2021 CLINICAL DATA:  Stroke, follow up EXAM: CT ANGIOGRAPHY HEAD AND NECK TECHNIQUE: Multidetector CT imaging of the head and neck was performed using  the standard protocol during bolus administration of intravenous contrast. Multiplanar CT image reconstructions and MIPs were obtained to evaluate the vascular anatomy. Carotid stenosis measurements (when applicable) are obtained utilizing NASCET criteria, using the distal internal carotid diameter as the denominator. RADIATION DOSE REDUCTION: This  exam was performed according to the departmental dose-optimization program which includes automated exposure control, adjustment of the mA and/or kV according to patient size and/or use of iterative reconstruction technique. CONTRAST:  49mL OMNIPAQUE IOHEXOL 350 MG/ML SOLN COMPARISON:  MRI and MRA from the same day. CT code stroke also from the same day. FINDINGS: CTA NECK FINDINGS Aortic arch: Great vessel origins are patent without significant stenosis. Aortic atherosclerosis. Right carotid system: Atherosclerosis at the carotid bifurcation and involving the common carotid artery and proximal internal carotid artery without greater than 50% stenosis. Left carotid system: Atherosclerosis at the carotid bifurcation and involving the common carotid artery and proximal internal carotid artery without greater than 50% stenosis. Vertebral arteries: Severe stenosis of the left vertebral artery origin. Otherwise, the vertebral arteries are patent without greater than 50% stenosis. Skeleton: No acute fracture. Other neck: No acute findings. Upper chest: Emphysema. Review of the MIP images confirms the above findings CTA HEAD FINDINGS Anterior circulation: Bilateral intracranial ICAs are patent. Moderate stenosis of the proximal left cavernous ICA. Early branching of the right MCA. Bilateral MCAs are patent without proximal high-grade stenosis. Hypoplastic or absent left A1 ACA, probably congenital. Otherwise, ACAs are patent. Posterior circulation: Bilateral intradural vertebral arteries and the basilar artery are patent without significant stenosis. Occlusion of the left mid P2  PCA. Right PCAs patent without high-grade stenosis. Venous sinuses: As permitted by contrast timing, patent. Anatomic variants: Detailed above. Review of the MIP images confirms the above findings IMPRESSION: 1. Occlusion of the left mid P2 PCA. 2. Severe stenosis of the left vertebral artery origin. 3. Moderate left proximal cavernous ICA stenosis. 4. Aortic Atherosclerosis (ICD10-I70.0) and Emphysema (ICD10-J43.9). Electronically Signed   By: Feliberto Harts M.D.   On: 12/17/2021 18:41   MR BRAIN WO CONTRAST  Result Date: 12/17/2021 CLINICAL DATA:  Stroke, follow up; Neuro deficit, acute, stroke suspected EXAM: MRI HEAD WITHOUT CONTRAST MRA HEAD WITHOUT CONTRAST TECHNIQUE: Multiplanar, multi-echo pulse sequences of the brain and surrounding structures were acquired without intravenous contrast. Angiographic images of the Circle of Willis were acquired using MRA technique without intravenous contrast. COMPARISON:  None Available. FINDINGS: MRI HEAD FINDINGS Brain: Acute left PCA territory infarct, including restricted diffusion in the left occipital lobe, left hippocampus, and left thalamocapsular region. Associated edema without significant mass effect or midline shift. Trace curvilinear T1 hyperintense in this region could represent trace subarachnoid hemorrhage and/or petechial hemorrhage. No mass occupying acute hemorrhage. No hydrocephalus or mass lesion. In Vascular: See below. Skull and upper cervical spine: Normal marrow signal. Sinuses/Orbits: Largely clear sinuses.  No acute orbital findings. MRA HEAD FINDINGS Moderate to severely motion limited study. Anterior circulation: Bilateral intracranial ICAs and proximal MCAs appear grossly patent. 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 The ACAs are otherwise patent proximally. Nondiagnostic evaluation for stenosis. Posterior circulation: Moderate to severely motion limited study. The P1 and proximal P2 PCAs are patent. The mid and distal left  P2 PCA flow void is not visualized, raising concern for possible stenosis or occlusion; however, artifact makes it difficult to confirm. IMPRESSION: MRI: 1. Acute left PCA territory infarct, detailed above. 2. Trace curvilinear T1 hyperintense in this region could represent trace subarachnoid hemorrhage and/or petechial hemorrhage. No mass occupying acute hemorrhage. MRA: Moderate to severely motion limited study. The P1 and proximal P2 PCAs are patent. The mid and distal left P2 PCA flow void is not visualized, raising concern for possible stenosis or occlusion; however, artifact makes it difficult to confirm. A CTA  could probably better evaluate if clinically warranted. Electronically Signed   By: Feliberto Harts M.D.   On: 12/17/2021 14:36   MR ANGIO HEAD WO CONTRAST  Result Date: 12/17/2021 CLINICAL DATA:  Stroke, follow up; Neuro deficit, acute, stroke suspected EXAM: MRI HEAD WITHOUT CONTRAST MRA HEAD WITHOUT CONTRAST TECHNIQUE: Multiplanar, multi-echo pulse sequences of the brain and surrounding structures were acquired without intravenous contrast. Angiographic images of the Circle of Willis were acquired using MRA technique without intravenous contrast. COMPARISON:  None Available. FINDINGS: MRI HEAD FINDINGS Brain: Acute left PCA territory infarct, including restricted diffusion in the left occipital lobe, left hippocampus, and left thalamocapsular region. Associated edema without significant mass effect or midline shift. Trace curvilinear T1 hyperintense in this region could represent trace subarachnoid hemorrhage and/or petechial hemorrhage. No mass occupying acute hemorrhage. No hydrocephalus or mass lesion. In Vascular: See below. Skull and upper cervical spine: Normal marrow signal. Sinuses/Orbits: Largely clear sinuses.  No acute orbital findings. MRA HEAD FINDINGS Moderate to severely motion limited study. Anterior circulation: Bilateral intracranial ICAs and proximal MCAs appear grossly  patent. 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 The ACAs are otherwise patent proximally. Nondiagnostic evaluation for stenosis. Posterior circulation: Moderate to severely motion limited study. The P1 and proximal P2 PCAs are patent. The mid and distal left P2 PCA flow void is not visualized, raising concern for possible stenosis or occlusion; however, artifact makes it difficult to confirm. IMPRESSION: MRI: 1. Acute left PCA territory infarct, detailed above. 2. Trace curvilinear T1 hyperintense in this region could represent trace subarachnoid hemorrhage and/or petechial hemorrhage. No mass occupying acute hemorrhage. MRA: Moderate to severely motion limited study. The P1 and proximal P2 PCAs are patent. The mid and distal left P2 PCA flow void is not visualized, raising concern for possible stenosis or occlusion; however, artifact makes it difficult to confirm. A CTA could probably better evaluate if clinically warranted. Electronically Signed   By: Feliberto Harts M.D.   On: 12/17/2021 14:36   CT HEAD CODE STROKE WO CONTRAST  Result Date: 12/17/2021 CLINICAL DATA:  Code stroke. EXAM: CT HEAD WITHOUT CONTRAST TECHNIQUE: Contiguous axial images were obtained from the base of the skull through the vertex without intravenous contrast. RADIATION DOSE REDUCTION: This exam was performed according to the departmental dose-optimization program which includes automated exposure control, adjustment of the mA and/or kV according to patient size and/or use of iterative reconstruction technique. COMPARISON:  None Available. FINDINGS: Brain: Hypoattenuation and loss of gray-white differentiation in the left PCA territory including the occipital lobe and left hippocampus. Mild intermixed hyperdensity may represent petechial hemorrhage versus spared cortex. No mass occupying acute hemorrhage. No significant mass effect or midline shift. No hydrocephalus or mass lesion. Vascular: No hyperdense vessel  identified. Skull: No acute fracture. Sinuses/Orbits: Clear sinuses.  No acute orbital findings. Other: No mastoid effusions IMPRESSION: Acute or subacute left PCA territory infarct with possible petechial hemorrhage. No mass occupying acute hemorrhage. No significant mass effect or midline shift. Code stroke imaging results were communicated on 12/17/2021 at 12:19 pm to provider Dr. Amada Jupiter via telephone, who verbally acknowledged these results. Electronically Signed   By: Feliberto Harts M.D.   On: 12/17/2021 12:21    Microbiology: Results for orders placed or performed during the hospital encounter of 12/17/21  Resp Panel by RT-PCR (Flu A&B, Covid) Anterior Nasal Swab     Status: None   Collection Time: 12/17/21 12:31 PM   Specimen: Anterior Nasal Swab  Result Value Ref Range  Status   SARS Coronavirus 2 by RT PCR NEGATIVE NEGATIVE Final    Comment: (NOTE) SARS-CoV-2 target nucleic acids are NOT DETECTED.  The SARS-CoV-2 RNA is generally detectable in upper respiratory specimens during the acute phase of infection. The lowest concentration of SARS-CoV-2 viral copies this assay can detect is 138 copies/mL. A negative result does not preclude SARS-Cov-2 infection and should not be used as the sole basis for treatment or other patient management decisions. A negative result may occur with  improper specimen collection/handling, submission of specimen other than nasopharyngeal swab, presence of viral mutation(s) within the areas targeted by this assay, and inadequate number of viral copies(<138 copies/mL). A negative result must be combined with clinical observations, patient history, and epidemiological information. The expected result is Negative.  Fact Sheet for Patients:  BloggerCourse.comhttps://www.fda.gov/media/152166/download  Fact Sheet for Healthcare Providers:  SeriousBroker.ithttps://www.fda.gov/media/152162/download  This test is no t yet approved or cleared by the [place] FDA and  has been  authorized for detection and/or diagnosis of SARS-CoV-2 by FDA under an Emergency Use Authorization (EUA). This EUA will remain  in effect (meaning this test can be used) for the duration of the COVID-19 declaration under Section 564(b)(1) of the Act, 21 U.S.C.section 360bbb-3(b)(1), unless the authorization is terminated  or revoked sooner.       Influenza A by PCR NEGATIVE NEGATIVE Final   Influenza B by PCR NEGATIVE NEGATIVE Final    Comment: (NOTE) The Xpert Xpress SARS-CoV-2/FLU/RSV plus assay is intended as an aid in the diagnosis of influenza from Nasopharyngeal swab specimens and should not be used as a sole basis for treatment. Nasal washings and aspirates are unacceptable for Xpert Xpress SARS-CoV-2/FLU/RSV testing.  Fact Sheet for Patients: BloggerCourse.comhttps://www.fda.gov/media/152166/download  Fact Sheet for Healthcare Providers: SeriousBroker.ithttps://www.fda.gov/media/152162/download  This test is not yet approved or cleared by the [place] FDA and has been authorized for detection and/or diagnosis of SARS-CoV-2 by FDA under an Emergency Use Authorization (EUA). This EUA will remain in effect (meaning this test can be used) for the duration of the COVID-19 declaration under Section 564(b)(1) of the Act, 21 U.S.C. section 360bbb-3(b)(1), unless the authorization is terminated or revoked.  Performed at [facility], [address] [place], [place] [id number]     Labs: CBC: Recent Labs  Lab 12/17/21 1204  WBC 8.4  NEUTROABS 5.9  HGB 14.9  HCT 43.8  MCV 89.4  PLT 247   Basic Metabolic Panel: Recent Labs  Lab 12/17/21 1204 12/17/21 1213 12/18/21 0436  NA 140  --  140  K 4.2  --  3.8  CL 107  --  109  CO2 25  --  24  GLUCOSE 108*  --  100*  BUN 16  --  13  CREATININE 1.15* 1.30* 1.07*  CALCIUM 9.5  --  9.0   Liver Function Tests: Recent Labs  Lab 12/17/21 1204  AST 17  ALT 12  ALKPHOS 121  BILITOT 0.7  PROT 7.9  ALBUMIN 4.3   CBG: Recent Labs   Lab 12/17/21 1200  GLUCAP 110*    Discharge time spent: greater than 30 minutes.  Signed: Marrion Coyekui Isayah Ignasiak, MD [facility] 12/18/2021

## 2021-12-18 NOTE — Evaluation (Signed)
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 History includes migraine, tobacco abuse, and PVD.   Clinical Impression   Patient presenting with decreased Ind in self care, balance, functional mobility/transfers, endurance, and safety awareness. Patient reports living with husband and being independent at baseline with all aspects of self care, IADLs, and driving. She appears to be very active at baseline.  Patient performs bed mobility with supervision. She is very dizzy and nauseated with movement and cued to focus on 1 object and is doing some better with this but overall just feeling unwell. Pt stands x 2 reps with min A and side steps along EOB with min HHA. She did not feel that she could ambulate into bathroom during this session.  Patient will benefit from acute OT to increase overall independence in the areas of ADLs, functional mobility, and safety awareness in order to safely discharge home.      Recommendations for follow up therapy are one component of a multi-disciplinary discharge planning process, led by the attending physician.  Recommendations may be updated based on patient status, additional functional criteria and insurance authorization.   Follow Up Recommendations  Home health OT    Assistance Recommended at Discharge Intermittent Supervision/Assistance  Patient can return home with the following A lot of help with walking and/or transfers;A little help with bathing/dressing/bathroom;Assistance with cooking/housework;Help with stairs or ramp for entrance;Assist for transportation    Functional Status Assessment  Patient has had a recent decline in their functional status and demonstrates the ability to make significant improvements in function in a reasonable and  predictable amount of time.  Equipment Recommendations  BSC/3in1       Precautions / Restrictions Precautions Precautions: Fall Restrictions Weight Bearing Restrictions: No      Mobility Bed Mobility Overal bed mobility: Needs Assistance Bed Mobility: Supine to Sit, Sit to Supine     Supine to sit: Supervision Sit to supine: Supervision   General bed mobility comments: no physical assistance provided but cued for technique and hand placement    Transfers Overall transfer level: Needs assistance Equipment used: 1 person hand held assist Transfers: Sit to/from Stand Sit to Stand: Min assist                  Balance Overall balance assessment: Needs assistance Sitting-balance support: Feet supported Sitting balance-Leahy Scale: Good     Standing balance support: During functional activity, Bilateral upper extremity supported Standing balance-Leahy Scale: Fair                             ADL either performed or assessed with clinical judgement   ADL Overall ADL's : Needs assistance/impaired                                     Functional mobility during ADLs: Minimal assistance General ADL Comments: Pt is very limited this session secondary to nausea and dizziness     Vision Patient Visual Report: Blurring of vision Vision Assessment?: Vision impaired- to be further tested in functional context Additional Comments: pt reports feeling very "dizzy" throughout which made it difficult to formally test  Pertinent Vitals/Pain Pain Assessment Pain Assessment: No/denies pain     Hand Dominance Left   Extremity/Trunk Assessment Upper Extremity Assessment Upper Extremity Assessment: RUE deficits/detail RUE Deficits / Details: 3+/5 throughout and able demonstrate ~75 degrees AROM shoulder elevation and WNLs for PROM RUE Coordination: decreased fine motor;decreased gross motor   Lower Extremity Assessment Lower Extremity  Assessment: Generalized weakness       Communication Communication Communication: No difficulties   Cognition Arousal/Alertness: Awake/alert Behavior During Therapy: WFL for tasks assessed/performed Overall Cognitive Status: Within Functional Limits for tasks assessed                                 General Comments: alert and oriented; agreeable to session                Home Living Family/patient expects to be discharged to:: Private residence Living Arrangements: Spouse/significant other Available Help at Discharge: Family;Available 24 hours/day Type of Home: House Home Access: Stairs to enter [employer] of Steps: 1 Entrance Stairs-Rails: None Home Layout: One level               Home Equipment: Crutches          Prior Functioning/Environment Prior Level of Function : Independent/Modified Independent;Driving             Mobility Comments: reports no recent falls, indep prior ADLs Comments: Ind in all aspects of ADLs and IADLs and helps with watching grandchildren        OT Problem List: Decreased strength;Decreased activity tolerance;Impaired balance (sitting and/or standing);Decreased coordination;Decreased safety awareness;Impaired UE functional use;Decreased knowledge of use of DME or AE      OT Treatment/Interventions: Self-care/ADL training;Therapeutic exercise;Therapeutic activities;Energy conservation;DME and/or AE instruction;Balance training;Patient/family education;Neuromuscular education    OT Goals(Current goals can be found in the care plan section) Acute Rehab OT Goals Patient Stated Goal: to decrease dizziness OT Goal Formulation: With patient Time For Goal Achievement: 01/01/22 Potential to Achieve Goals: Good ADL Goals Pt Will Perform Grooming: with supervision Pt Will Perform Lower Body Dressing: with supervision Pt Will Transfer to Toilet: with supervision Pt Will Perform Toileting - Clothing  Manipulation and hygiene: with supervision  OT Frequency: Min 2X/week       AM-PAC OT "6 Clicks" Daily Activity     Outcome Measure Help from another person eating meals?: A Little Help from another person taking care of personal grooming?: A Little Help from another person toileting, which includes using toliet, bedpan, or urinal?: A Little Help from another person bathing (including washing, rinsing, drying)?: A Little Help from another person to put on and taking off regular upper body clothing?: A Little Help from another person to put on and taking off regular lower body clothing?: A Lot 6 Click Score: 17   End of Session Nurse Communication: Mobility status  Activity Tolerance: Other (comment) (limited by nausea and vomiting) Patient left: in bed;with call bell/phone within reach;with bed alarm set;with family/visitor present  OT Visit Diagnosis: Unsteadiness on feet (R26.81);Repeated falls (R29.6);Muscle weakness (generalized) (M62.81)                Time: 3976-7341 OT Time Calculation (min): 21 min Charges:  OT General Charges $OT Visit: 1 Visit OT Evaluation $OT Eval Moderate Complexity: 1 Mod OT Treatments $Neuromuscular Re-education: 8-22 mins  Jackquline Denmark, MS, OTR/L , CBIS ascom [phone]  12/18/21, 12:19 PM

## 2021-12-18 NOTE — Evaluation (Signed)
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 History includes migraine, tobacco abuse, and PVD.  Clinical Impression  Pt is a pleasant 71 year old female who was admitted for acute CVA. Pt performs bed mobility with supervision and then began vomiting once at EOB, unable to further complete evaluation. While in supine, FTN decreased secondary to weakness on R UE in addition to N/T in R hemibody. Pt demonstrates deficits with strength/mobility/sensation. Pt is currently not at baseline level. Would benefit from skilled PT to address above deficits and promote optimal return to PLOF. With limited evaluation, recommend transition to HHPT upon discharge from acute hospitalization. Per RN staff, pt has been unsteady with ambulation to/from bathroom. Pt may be a CIR candidate once functional mobility assessed. Will continue to progress.      Recommendations for follow up therapy are one component of a multi-disciplinary discharge planning process, led by the attending physician.  Recommendations may be updated based on patient status, additional functional criteria and insurance authorization.  Follow Up Recommendations Home health PT      Assistance Recommended at Discharge Frequent or constant Supervision/Assistance  Patient can return home with the following  A little help with walking and/or transfers;A little help with bathing/dressing/bathroom;Help with stairs or ramp for entrance    Equipment Recommendations Rolling walker (2 wheels);BSC/3in1  Recommendations for Other Services       Functional Status Assessment Patient has had a recent decline in their functional status and demonstrates the ability to make significant improvements in function in a reasonable and predictable amount of  time.     Precautions / Restrictions Precautions Precautions: Fall Restrictions Weight Bearing Restrictions: No      Mobility  Bed Mobility Overal bed mobility: Needs Assistance Bed Mobility: Supine to Sit     Supine to sit: Supervision     General bed mobility comments: safe technique with ease of movement. Once seated at EOB, pt became very nauseated and began vomiting excessively. Unable to further perform mobility. Was able to perform several lateral scoots up towards [facility] prior to returning back to bed.    Transfers                   General transfer comment: unable to attempt    Ambulation/Gait                  Stairs            Wheelchair Mobility    Modified Rankin (Stroke Patients Only)       Balance Overall balance assessment: Needs assistance Sitting-balance support: Feet supported Sitting balance-Leahy Scale: Good Sitting balance - Comments: upright posture noted; limited tolerance due to nausea                                     Pertinent Vitals/Pain Pain Assessment Pain Assessment: No/denies pain (does complain of restless legs)    Home Living Family/patient expects to be discharged to:: Private residence Living Arrangements: Spouse/significant other Available Help at Discharge: Family;Available 24 hours/day (spouse is retired) Type of Home: House Home Access: Stairs to enter Entrance Stairs-Rails: None Secretary/administrator of Steps: 1   Home Layout: One level Home Equipment: Crutches  Prior Function Prior Level of Function : Independent/Modified Independent;Driving             Mobility Comments: reports no recent falls, indep prior       Hand Dominance        Extremity/Trunk Assessment   Upper Extremity Assessment Upper Extremity Assessment: Generalized weakness (R UE grip 3/5; 2/5 elbow flex/ext; unable to reach about ~60 degrees over head. N/T)    Lower Extremity  Assessment Lower Extremity Assessment: Generalized weakness (R LE grossly 3+/5; L LE grossly 4+/5; N/T)       Communication   Communication: No difficulties  Cognition Arousal/Alertness: Awake/alert Behavior During Therapy: WFL for tasks assessed/performed Overall Cognitive Status: Within Functional Limits for tasks assessed                                 General Comments: alert and oriented; agreeable to session        General Comments      Exercises     Assessment/Plan    PT Assessment Patient needs continued PT services  PT Problem List Decreased strength;Decreased activity tolerance;Decreased mobility;Decreased coordination;Impaired sensation       PT Treatment Interventions DME instruction;Gait training;Stair training;Therapeutic exercise;Balance training    PT Goals (Current goals can be found in the Care Plan section)  Acute Rehab PT Goals Patient Stated Goal: to go home if possible PT Goal Formulation: With patient Time For Goal Achievement: 01/01/22 Potential to Achieve Goals: Good    Frequency 7X/week     Co-evaluation               AM-PAC PT "6 Clicks" Mobility  Outcome Measure Help needed turning from your back to your side while in a flat bed without using bedrails?: None Help needed moving from lying on your back to sitting on the side of a flat bed without using bedrails?: None Help needed moving to and from a bed to a chair (including a wheelchair)?: A Little Help needed standing up from a chair using your arms (e.g., wheelchair or bedside chair)?: A Little Help needed to walk in hospital room?: A Little Help needed climbing 3-5 steps with a railing? : A Lot 6 Click Score: 19    End of Session   Activity Tolerance: Treatment limited secondary to medical complications (Comment) Patient left: in bed;with bed alarm set Nurse Communication: Mobility status PT Visit Diagnosis: Unsteadiness on feet (R26.81);Muscle weakness  (generalized) (M62.81);Difficulty in walking, not elsewhere classified (R26.2);Hemiplegia and hemiparesis Hemiplegia - Right/Left: Right Hemiplegia - caused by: Cerebral infarction    Time: 9833-8250 PT Time Calculation (min) (ACUTE ONLY): 16 min   Charges:   PT Evaluation $PT Eval Low Complexity: 1 Low          Elizabeth Palau, PT, DPT, GCS [phone]   Kamri Gotsch 12/18/2021, 9:45 AM

## 2021-12-18 NOTE — Progress Notes (Signed)
SLP Cancellation Note  Patient Details Name: Sheila Fleming MRN: [id number] DOB: 1950/04/14   Cancelled treatment:       Reason Eval/Treat Not Completed: SLP screened, no needs identified, will sign off (chart reviewed; consulted NSG then met w/ pt in room) Pt denied any difficulty swallowing and is currently on a regular diet; tolerates swallowing pills w/ water per NSG. She endorsed feelings of dry heaving and nausea; pt sipping on a soda. Pt conversed in conversation w/out expressive/receptive deficits noted; pt denied any speech-language deficits. Speech clear. Pt did c/o restless leg and the nausea. NSG present and aware. No further skilled ST services indicated as pt appears at her baseline for communication. Pt agreed. NSG to reconsult if any change in status while admitted.       Orinda Kenner, MS, CCC-SLP Speech Language Pathologist Rehab Services; [place] [phone] (ascom) Jenissa Tyrell 12/18/2021, 7:51 AM

## 2023-12-08 ENCOUNTER — Other Ambulatory Visit (HOSPITAL_COMMUNITY): Payer: Self-pay | Admitting: Internal Medicine

## 2024-02-19 ENCOUNTER — Other Ambulatory Visit: Payer: Self-pay | Admitting: Adult Health Nurse Practitioner

## 2024-02-19 DIAGNOSIS — F17210 Nicotine dependence, cigarettes, uncomplicated: Secondary | ICD-10-CM

## 2024-02-27 ENCOUNTER — Ambulatory Visit
Admission: RE | Admit: 2024-02-27 | Discharge: 2024-02-27 | Disposition: A | Discharge status: Home / Self Care | Source: Ambulatory Visit | Attending: Adult Health Nurse Practitioner | Admitting: Adult Health Nurse Practitioner

## 2024-02-27 DIAGNOSIS — F17210 Nicotine dependence, cigarettes, uncomplicated: Secondary | ICD-10-CM
